# Patient Record
Sex: Female | Born: 1980
Health system: Southern US, Community
[De-identification: ages and names within clinical notes are randomized; demographics above are authoritative.]

## PROBLEM LIST (undated history)

## (undated) ENCOUNTER — Inpatient Hospital Stay (HOSPITAL_COMMUNITY): Payer: Self-pay

## (undated) DIAGNOSIS — R3129 Other microscopic hematuria: Secondary | ICD-10-CM

## (undated) DIAGNOSIS — M26609 Unspecified temporomandibular joint disorder, unspecified side: Secondary | ICD-10-CM

## (undated) DIAGNOSIS — K2970 Gastritis, unspecified, without bleeding: Secondary | ICD-10-CM

## (undated) DIAGNOSIS — F411 Generalized anxiety disorder: Secondary | ICD-10-CM

## (undated) DIAGNOSIS — K219 Gastro-esophageal reflux disease without esophagitis: Secondary | ICD-10-CM

## (undated) DIAGNOSIS — T8859XA Other complications of anesthesia, initial encounter: Secondary | ICD-10-CM

## (undated) DIAGNOSIS — T4145XA Adverse effect of unspecified anesthetic, initial encounter: Secondary | ICD-10-CM

## (undated) HISTORY — DX: Generalized anxiety disorder: F41.1

## (undated) HISTORY — DX: Gastro-esophageal reflux disease without esophagitis: K21.9

## (undated) HISTORY — PX: WISDOM TOOTH EXTRACTION: SHX21

## (undated) HISTORY — DX: Unspecified temporomandibular joint disorder, unspecified side: M26.609

## (undated) HISTORY — DX: Gastritis, unspecified, without bleeding: K29.70

## (undated) HISTORY — DX: Other microscopic hematuria: R31.29

---
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
## 2012-01-01 ENCOUNTER — Other Ambulatory Visit: Payer: Self-pay | Admitting: Physician Assistant

## 2012-01-01 LAB — CBC WITH DIFFERENTIAL/PLATELET
Basophil #: 0.1 10*3/uL (ref 0.0–0.1)
Eosinophil #: 0.3 10*3/uL (ref 0.0–0.7)
Eosinophil %: 3.3 %
Lymphocyte #: 2.3 10*3/uL (ref 1.0–3.6)
MCH: 29.2 pg (ref 26.0–34.0)
MCHC: 34.1 g/dL (ref 32.0–36.0)
MCV: 86 fL (ref 80–100)
Monocyte #: 0.9 x10 3/mm (ref 0.2–0.9)
Neutrophil %: 56.8 %
Platelet: 247 10*3/uL (ref 150–440)
RBC: 4.59 10*6/uL (ref 3.80–5.20)
RDW: 13.3 % (ref 11.5–14.5)

## 2012-03-07 ENCOUNTER — Emergency Department (INDEPENDENT_AMBULATORY_CARE_PROVIDER_SITE_OTHER)
Admission: EM | Admit: 2012-03-07 | Discharge: 2012-03-07 | Disposition: A | Discharge status: Home / Self Care | Payer: Self-pay | Source: Home / Self Care | Attending: Family Medicine | Admitting: Family Medicine

## 2012-03-07 ENCOUNTER — Encounter (HOSPITAL_COMMUNITY): Payer: Self-pay

## 2012-03-07 DIAGNOSIS — H6690 Otitis media, unspecified, unspecified ear: Secondary | ICD-10-CM

## 2012-03-07 DIAGNOSIS — H6691 Otitis media, unspecified, right ear: Secondary | ICD-10-CM

## 2012-03-07 MED ORDER — AMOXICILLIN-POT CLAVULANATE 875-125 MG PO TABS: 1.0000 | ORAL_TABLET | Freq: Two times a day (BID) | ORAL | Status: DC

## 2012-03-07 NOTE — ED Provider Notes (Signed)
History     CSN: [id number]  Arrival date & time 03/07/12  1807   First MD Initiated Contact with Patient 03/07/12 1811      Chief Complaint  Patient presents with  . Otalgia    (Consider location/radiation/quality/duration/timing/severity/associated sxs/prior treatment) Patient is a 31 y.o. female presenting with ear pain. The history is provided by the patient.  Otalgia This is a new problem. The current episode started 2 days ago. There is pain in the right ear. The problem has been gradually worsening. There has been no fever. The pain is mild. Associated symptoms include hearing loss and rhinorrhea. Pertinent negatives include no ear discharge, no sore throat, no diarrhea and no vomiting. Her past medical history is significant for hearing loss.    History reviewed. No pertinent past medical history.  History reviewed. No pertinent past surgical history.  History reviewed. No pertinent family history.  History  Substance Use Topics  . Smoking status: Not on file  . Smokeless tobacco: Not on file  . Alcohol Use: Not on file    OB History    Grav Para Term Preterm Abortions TAB SAB Ect Mult Living                  Review of Systems  Constitutional: Negative.   HENT: Positive for hearing loss, ear pain, congestion and rhinorrhea. Negative for sore throat and ear discharge.   Respiratory: Negative.   Gastrointestinal: Negative for vomiting and diarrhea.    Allergies  Sulfa antibiotics  Home Medications   Current Outpatient Rx  Name  Route  Sig  Dispense  Refill  . AMOXICILLIN-POT CLAVULANATE 875-125 MG PO TABS   Oral   Take 1 tablet by mouth 2 (two) times daily.   20 tablet   0     BP 111/54  Pulse 78  Temp 98.4 F (36.9 C) (Oral)  Resp 16  SpO2 100%  Physical Exam  Nursing note and vitals reviewed. Constitutional: She is oriented to person, place, and time. She appears well-developed and well-nourished.  HENT:  Head: Normocephalic.  Right  Ear: Ear canal normal. Tympanic membrane is injected and erythematous. Tympanic membrane mobility is abnormal.  Left Ear: Hearing, tympanic membrane and external ear normal.  Nose: Nose normal.  Mouth/Throat: Oropharynx is clear and moist.  Eyes: Conjunctivae normal are normal. Pupils are equal, round, and reactive to light.  Neck: Normal range of motion. Neck supple.  Neurological: She is alert and oriented to person, place, and time.  Skin: Skin is warm and dry.    ED Course  Procedures (including critical care time)  Labs Reviewed - No data to display No results found.   1. Otitis media of right ear       MDM          Linna Hoff, MD 03/07/12 1900

## 2012-03-07 NOTE — ED Notes (Signed)
Reports pain, fullness in ear, can't hear in ear ; ?ear infection

## 2012-04-04 ENCOUNTER — Encounter: Payer: Self-pay | Admitting: Family Medicine

## 2012-04-04 ENCOUNTER — Ambulatory Visit (INDEPENDENT_AMBULATORY_CARE_PROVIDER_SITE_OTHER): Payer: 59 | Admitting: Family Medicine

## 2012-04-04 VITALS — BP 112/70 | HR 72 | Temp 98.2°F | Ht 62.0 in | Wt 144.0 lb

## 2012-04-04 DIAGNOSIS — H9201 Otalgia, right ear: Secondary | ICD-10-CM

## 2012-04-04 DIAGNOSIS — H9209 Otalgia, unspecified ear: Secondary | ICD-10-CM

## 2012-04-04 DIAGNOSIS — F411 Generalized anxiety disorder: Secondary | ICD-10-CM

## 2012-04-04 MED ORDER — CLONAZEPAM 1 MG PO TABS: ORAL_TABLET | ORAL | Status: DC

## 2012-04-04 MED ORDER — CITALOPRAM HYDROBROMIDE 20 MG PO TABS: 20.0000 mg | ORAL_TABLET | Freq: Every day | ORAL | Status: DC

## 2012-04-04 NOTE — Assessment & Plan Note (Signed)
Citalopram 20mg  qd started today, #30, RF x 1.  Therapeutic expectations and side effect profile of medication discussed today.  Patient's questions answered. Clonazepam 1mg , 1/2-1 tab q12h prn, #15, no RF---for use if needed while waiting for the citalopram to help. Therapeutic expectations and side effect profile of medication discussed today.  Patient's questions answered.

## 2012-04-04 NOTE — Assessment & Plan Note (Signed)
Resolving.  Sounds like she still has occasional eustacian tube dysfunction but ear has healed completely.

## 2012-04-04 NOTE — Progress Notes (Signed)
Office Note 04/04/2012  CC:  Chief Complaint  Patient presents with  . Establish Care    anxiety and recheck right ear infection    HPI:  Heather Conley is a 32 y.o. White female who is here to establish care. Patient's most recent primary MD: Dr. Lenon Ahmadi in [place], [place].  GYN MD was Dr. Roxan Hockey in [place], [place]. Old records from recent ED visit for ear pain were reviewed prior to or during today's visit.  She had right AOM and was given augmentin.  Currently she feels no persistent right ear complaints.  Has occ feeling that it closes --does valsalva against closed nose and it opens.  Occ random sharp pain in ear--shooting pain.  None of this in the last week, though.  No longer has any nasal cong, runny nose, or cough.  Hx of anxiety: worries about everything, often irritable and can't concentrate, sometimes almost has panic attacks but never had a full blown panic attack.  No depressed mood.  Sleep is fine and appetite is fine.  Zoloft helped in past but sexual side effects were too intense and she d/c'd the med.  She is interested in trying a different med.  Past Medical History  Diagnosis Date  . Chicken pox   . GAD (generalized anxiety disorder)     zoloft in the past helped but sexual side effects caused her to d/c it.    Past Surgical History  Procedure Date  . Wisdom tooth extraction approx 2007    No complications    Family History  Problem Relation Age of Onset  . Alcohol abuse Mother   . Heart disease Maternal Uncle   . Heart disease Maternal Grandfather   . Stroke Paternal Grandmother     History   Social History  . Marital Status: Single    Spouse Name: N/A    Number of Children: N/A  . Years of Education: N/A   Occupational History  . Not on file.   Social History Main Topics  . Smoking status: Never Smoker   . Smokeless tobacco: Never Used  . Alcohol Use: Yes     Comment: social  . Drug Use: No  . Sexually Active: Not on file   Other Topics Concern  .  Not on file   Social History Narrative   Single, no children.  Lives with fiance in [place].Orig from [place].  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   MEDS: ortho tri cyclen 1 tab qd  Allergies  Allergen Reactions  . Sulfa Antibiotics     ROS Review of Systems  Constitutional: Negative for fever and fatigue.  HENT: Positive for ear pain. Negative for congestion and sore throat.   Eyes: Negative for visual disturbance.  Respiratory: Negative for cough.   Cardiovascular: Negative for chest pain.  Gastrointestinal: Negative for nausea and abdominal pain.  Genitourinary: Negative for dysuria.  Musculoskeletal: Negative for back pain and joint swelling.  Skin: Negative for rash.  Neurological: Negative for weakness and headaches.  Hematological: Negative for adenopathy.  Psychiatric/Behavioral: The patient is nervous/anxious.     PE; Blood pressure 112/70, pulse 72, temperature 98.2 F (36.8 C), temperature source Temporal, height 5\' 2"  (1.575 m), weight 144 lb (65.318 kg), SpO2 98.00%. Gen: Alert, well appearing.  Patient is oriented to person, place, time, and situation. AFFECT: pleasant, lucid thought and speech. ENT: Ears: EACs clear, normal epithelium.  TMs with good light reflex and landmarks bilaterally.  Eyes: no injection, icteris, swelling, or exudate.  EOMI, PERRLA. Nose: no drainage or turbinate edema/swelling.  No injection or focal lesion.  Mouth: lips without lesion/swelling.  Oral mucosa pink and moist.  Dentition intact and without obvious caries or gingival swelling.  Oropharynx without erythema, exudate, or swelling.  Neck - No masses or thyromegaly or limitation in range of motion CV: RRR, no m/r/g.   LUNGS: CTA bilat, nonlabored resps, good aeration in all lung fields. EXT: no clubbing, cyanosis, or edema.  Neuro: CN 2-12 intact bilaterally, strength 5/5 in  proximal and distal upper extremities and lower extremities bilaterally.  No sensory deficits.  No tremor.  No disdiadochokinesis.  No ataxia.  Upper extremity and lower extremity DTRs symmetric.  No pronator drift.  Pertinent labs:  None today  ASSESSMENT AND PLAN:   GAD (generalized anxiety disorder) Citalopram 20mg  qd started today, #30, RF x 1.  Therapeutic expectations and side effect profile of medication discussed today.  Patient's questions answered. Clonazepam 1mg , 1/2-1 tab q12h prn, #15, no RF---for use if needed while waiting for the citalopram to help. Therapeutic expectations and side effect profile of medication discussed today.  Patient's questions answered.   Right ear pain Resolving.  Sounds like she still has occasional eustacian tube dysfunction but ear has healed completely.    Return for make appt for CPE at your convenience (morning appt so fasting labs can be done).

## 2012-04-10 ENCOUNTER — Ambulatory Visit (INDEPENDENT_AMBULATORY_CARE_PROVIDER_SITE_OTHER): Payer: 59 | Admitting: Family Medicine

## 2012-04-10 ENCOUNTER — Encounter: Payer: Self-pay | Admitting: Family Medicine

## 2012-04-10 VITALS — BP 116/74 | HR 63 | Ht 62.0 in | Wt 142.0 lb

## 2012-04-10 DIAGNOSIS — L909 Atrophic disorder of skin, unspecified: Secondary | ICD-10-CM

## 2012-04-10 DIAGNOSIS — Z0000 Encounter for general adult medical examination without abnormal findings: Secondary | ICD-10-CM

## 2012-04-10 DIAGNOSIS — L918 Other hypertrophic disorders of the skin: Secondary | ICD-10-CM

## 2012-04-10 DIAGNOSIS — Z808 Family history of malignant neoplasm of other organs or systems: Secondary | ICD-10-CM

## 2012-04-10 LAB — CBC WITH DIFFERENTIAL/PLATELET
Basophils Absolute: 0 10*3/uL (ref 0.0–0.1)
Eosinophils Absolute: 0.2 10*3/uL (ref 0.0–0.7)
Eosinophils Relative: 3.9 % (ref 0.0–5.0)
HCT: 40.9 % (ref 36.0–46.0)
Lymphs Abs: 1.9 10*3/uL (ref 0.7–4.0)
MCHC: 32.9 g/dL (ref 30.0–36.0)
MCV: 85.5 fl (ref 78.0–100.0)
Monocytes Absolute: 0.6 10*3/uL (ref 0.1–1.0)
Neutro Abs: 3.2 10*3/uL (ref 1.4–7.7)
Neutrophils Relative %: 54.5 % (ref 43.0–77.0)
Platelets: 282 10*3/uL (ref 150.0–400.0)
WBC: 5.9 10*3/uL (ref 4.5–10.5)

## 2012-04-10 LAB — LIPID PANEL
Cholesterol: 176 mg/dL (ref 0–200)
HDL: 70.8 mg/dL (ref >39.00)
Triglycerides: 89 mg/dL (ref 0.0–149.0)

## 2012-04-10 LAB — COMPREHENSIVE METABOLIC PANEL
BUN: 17 mg/dL (ref 6–23)
CO2: 25 mEq/L (ref 19–32)
Calcium: 8.7 mg/dL (ref 8.4–10.5)
Chloride: 106 mEq/L (ref 96–112)
Creatinine, Ser: 0.7 mg/dL (ref 0.4–1.2)
GFR: 104.66 mL/min (ref >60.00)
Total Bilirubin: 0.6 mg/dL (ref 0.3–1.2)

## 2012-04-10 NOTE — Assessment & Plan Note (Signed)
Reviewed age and gender appropriate health maintenance issues (prudent diet, regular exercise, health risks of tobacco and excessive alcohol, use of seatbelts, fire alarms in home, use of sunscreen).  Also reviewed age and gender appropriate health screening as well as vaccine recommendations. Pt to get pap/pelvic via her GYN MD. She requested derm referral today due to some bothersome skin tags, +FH of some skin cancers.  I ordered this referral today. Pt to take MVI qd.  Vaccines UTD.

## 2012-04-10 NOTE — Patient Instructions (Signed)
Check for history of Tdap vaccine.  Health Maintenance, Females A healthy lifestyle and preventative care can promote health and wellness.  Maintain regular health, dental, and eye exams.  Eat a healthy diet. Foods like vegetables, fruits, whole grains, low-fat dairy products, and lean protein foods contain the nutrients you need without too many calories. Decrease your intake of foods high in solid fats, added sugars, and salt. Get information about a proper diet from your caregiver, if necessary.  Regular physical exercise is one of the most important things you can do for your health. Most adults should get at least 150 minutes of moderate-intensity exercise (any activity that increases your heart rate and causes you to sweat) each week. In addition, most adults need muscle-strengthening exercises on 2 or more days a week.   Maintain a healthy weight. The body mass index (BMI) is a screening tool to identify possible weight problems. It provides an estimate of body fat based on height and weight. Your caregiver can help determine your BMI, and can help you achieve or maintain a healthy weight. For adults 20 years and older:  A BMI below 18.5 is considered underweight.  A BMI of 18.5 to 24.9 is normal.  A BMI of 25 to 29.9 is considered overweight.  A BMI of 30 and above is considered obese.  Maintain normal blood lipids and cholesterol by exercising and minimizing your intake of saturated fat. Eat a balanced diet with plenty of fruits and vegetables. Blood tests for lipids and cholesterol should begin at age 27 and be repeated every 5 years. If your lipid or cholesterol levels are high, you are over 50, or you are a high risk for heart disease, you may need your cholesterol levels checked more frequently.Ongoing high lipid and cholesterol levels should be treated with medicines if diet and exercise are not effective.  If you smoke, find out from your caregiver how to quit. If you do not  use tobacco, do not start.  If you are pregnant, do not drink alcohol. If you are breastfeeding, be very cautious about drinking alcohol. If you are not pregnant and choose to drink alcohol, do not exceed 1 drink per day. One drink is considered to be 12 ounces (355 mL) of beer, 5 ounces (148 mL) of wine, or 1.5 ounces (44 mL) of liquor.  Avoid use of street drugs. Do not share needles with anyone. Ask for help if you need support or instructions about stopping the use of drugs.  High blood pressure causes heart disease and increases the risk of stroke. Blood pressure should be checked at least every 1 to 2 years. Ongoing high blood pressure should be treated with medicines, if weight loss and exercise are not effective.  If you are 25 to 30 years old, ask your caregiver if you should take aspirin to prevent strokes.  Diabetes screening involves taking a blood sample to check your fasting blood sugar level. This should be done once every 3 years, after age 54, if you are within normal weight and without risk factors for diabetes. Testing should be considered at a younger age or be carried out more frequently if you are overweight and have at least 1 risk factor for diabetes.  Breast cancer screening is essential preventative care for women. You should practice "breast self-awareness." This means understanding the normal appearance and feel of your breasts and may include breast self-examination. Any changes detected, no matter how small, should be reported to a caregiver. Women  in their 48s and 30s should have a clinical breast exam (CBE) by a caregiver as part of a regular health exam every 1 to 3 years. After age 65, women should have a CBE every year. Starting at age 62, women should consider having a mammogram (breast X-ray) every year. Women who have a family history of breast cancer should talk to their caregiver about genetic screening. Women at a high risk of breast cancer should talk to their  caregiver about having an MRI and a mammogram every year.  The Pap test is a screening test for cervical cancer. Women should have a Pap test starting at age 71. Between ages 62 and 44, Pap tests should be repeated every 2 years. Beginning at age 25, you should have a Pap test every 3 years as long as the past 3 Pap tests have been normal. If you had a hysterectomy for a problem that was not cancer or a condition that could lead to cancer, then you no longer need Pap tests. If you are between ages 81 and 11, and you have had normal Pap tests going back 10 years, you no longer need Pap tests. If you have had past treatment for cervical cancer or a condition that could lead to cancer, you need Pap tests and screening for cancer for at least 20 years after your treatment. If Pap tests have been discontinued, risk factors (such as a new sexual partner) need to be reassessed to determine if screening should be resumed. Some women have medical problems that increase the chance of getting cervical cancer. In these cases, your caregiver may recommend more frequent screening and Pap tests.  The human papillomavirus (HPV) test is an additional test that may be used for cervical cancer screening. The HPV test looks for the virus that can cause the cell changes on the cervix. The cells collected during the Pap test can be tested for HPV. The HPV test could be used to screen women aged 8 years and older, and should be used in women of any age who have unclear Pap test results. After the age of 82, women should have HPV testing at the same frequency as a Pap test.  Colorectal cancer can be detected and often prevented. Most routine colorectal cancer screening begins at the age of 51 and continues through age 6. However, your caregiver may recommend screening at an earlier age if you have risk factors for colon cancer. On a yearly basis, your caregiver may provide home test kits to check for hidden blood in the stool. Use  of a small camera at the end of a tube, to directly examine the colon (sigmoidoscopy or colonoscopy), can detect the earliest forms of colorectal cancer. Talk to your caregiver about this at age 16, when routine screening begins. Direct examination of the colon should be repeated every 5 to 10 years through age 43, unless early forms of pre-cancerous polyps or small growths are found.  Hepatitis C blood testing is recommended for all people born from [birth year] through 1963 and any individual with known risks for hepatitis C.  Practice safe sex. Use condoms and avoid high-risk sexual practices to reduce the spread of sexually transmitted infections (STIs). Sexually active women aged 64 and younger should be checked for Chlamydia, which is a common sexually transmitted infection. Older women with new or multiple partners should also be tested for Chlamydia. Testing for other STIs is recommended if you are sexually active and at increased risk.  Osteoporosis is a disease in which the bones lose minerals and strength with aging. This can result in serious bone fractures. The risk of osteoporosis can be identified using a bone density scan. Women ages 40 and over and women at risk for fractures or osteoporosis should discuss screening with their caregivers. Ask your caregiver whether you should be taking a calcium supplement or vitamin D to reduce the rate of osteoporosis.  Menopause can be associated with physical symptoms and risks. Hormone replacement therapy is available to decrease symptoms and risks. You should talk to your caregiver about whether hormone replacement therapy is right for you.  Use sunscreen with a sun protection factor (SPF) of 30 or greater. Apply sunscreen liberally and repeatedly throughout the day. You should seek shade when your shadow is shorter than you. Protect yourself by wearing long sleeves, pants, a wide-brimmed hat, and sunglasses year round, whenever you are outdoors.  Notify  your caregiver of new moles or changes in moles, especially if there is a change in shape or color. Also notify your caregiver if a mole is larger than the size of a pencil eraser.  Stay current with your immunizations. Document Released: 09/25/2010 Document Revised: 06/04/2011 Document Reviewed: 09/25/2010 [facility] Patient Information 2013 [place], [place].

## 2012-04-10 NOTE — Progress Notes (Signed)
Office Note 04/10/2012  CC:  Chief Complaint  Patient presents with  . Annual Exam    No problems    HPI:  Heather Conley is a 32 y.o. White female who is here for CPE. No new complaints. Started citalopram for GAD 6d/a and feels no significant side effects.  Anxiety/mood no different yet. She has not had to take any clonazepam.   Past Medical History  Diagnosis Date  . Chicken pox   . GAD (generalized anxiety disorder)     zoloft in the past helped but sexual side effects caused her to d/c it.    Past Surgical History  Procedure Date  . Wisdom tooth extraction approx 2007    No complications    Family History  Problem Relation Age of Onset  . Alcohol abuse Mother   . Heart disease Maternal Uncle   . Heart disease Maternal Grandfather   . Stroke Paternal Grandmother     History   Social History  . Marital Status: Single    Spouse Name: N/A    Number of Children: N/A  . Years of Education: N/A   Occupational History  . Not on file.   Social History Main Topics  . Smoking status: Never Smoker   . Smokeless tobacco: Never Used  . Alcohol Use: Yes     Comment: social  . Drug Use: No  . Sexually Active: Not on file   Other Topics Concern  . Not on file   Social History Narrative   Single, no children.  Lives with fiance in [place].Orig from [place].  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    Outpatient Prescriptions Prior to Visit  Medication Sig Dispense Refill  . citalopram (CELEXA) 20 MG tablet Take 1 tablet (20 mg total) by mouth daily.  30 tablet  1  . clonazePAM (KLONOPIN) 1 MG tablet 1/2-1 tab po q12h prn anxiety  15 tablet  0  . Norgestim-Eth Estrad Triphasic (ORTHO TRI-CYCLEN, 28, PO) Take 1 tablet by mouth daily.      Last reviewed on 04/10/2012  8:48 AM by Luisa Dago, CMA  Allergies  Allergen Reactions  . Sulfa Antibiotics      ROS Review of Systems  Constitutional: Negative for fever, chills, appetite change and fatigue.  HENT: Negative for ear pain, congestion, sore throat, neck stiffness and dental problem.   Eyes: Negative for discharge, redness and visual disturbance.  Respiratory: Negative for cough, chest tightness, shortness of breath and wheezing.   Cardiovascular: Negative for chest pain, palpitations and leg swelling.  Gastrointestinal: Negative for nausea, vomiting, abdominal pain, diarrhea and blood in stool.  Genitourinary: Negative for dysuria, urgency, frequency, hematuria, flank pain and difficulty urinating.  Musculoskeletal: Negative for myalgias, back pain, joint swelling and arthralgias.  Skin: Negative for pallor and rash.  Neurological: Negative for dizziness, speech difficulty, weakness and headaches.  Hematological: Negative for adenopathy. Does not bruise/bleed easily.  Psychiatric/Behavioral: Negative for confusion and sleep disturbance. The patient is nervous/anxious (chronic--recently started citalopram for this).     PE; Blood pressure 116/74, pulse 63, height 5\' 2"  (1.575 m), weight 142 lb (64.411 kg). Gen: Alert, well appearing.  Patient is oriented to person, place, time, and situation. AFFECT: pleasant, lucid thought and speech. ENT: Ears: EACs clear, normal epithelium.  TMs with good light reflex and landmarks bilaterally.  Eyes: no injection, icteris, swelling, or exudate.  EOMI,  PERRLA. Nose: no drainage or turbinate edema/swelling.  No injection or focal lesion.  Mouth: lips without lesion/swelling.  Oral mucosa pink and moist.  Dentition intact and without obvious caries or gingival swelling.  Oropharynx without erythema, exudate, or swelling.  Neck: supple/nontender.  No LAD, mass, or TM.   CV: RRR, no m/r/g.   LUNGS: CTA bilat, nonlabored resps, good aeration in all lung fields. ABD: soft, NT, ND, BS normal.  No hepatospenomegaly or mass.  No bruits. EXT: no clubbing,  cyanosis, or edema.  Musculoskeletal: no joint swelling, erythema, warmth, or tenderness.  ROM of all joints intact. Skin - no sores or suspicious lesions or rashes or color changes.  A few small, pigmented skin tags are noted on right shoulder area.    Pertinent labs:  None today  ASSESSMENT AND PLAN:   Health maintenance examination Reviewed age and gender appropriate health maintenance issues (prudent diet, regular exercise, health risks of tobacco and excessive alcohol, use of seatbelts, fire alarms in home, use of sunscreen).  Also reviewed age and gender appropriate health screening as well as vaccine recommendations. Pt to get pap/pelvic via her GYN MD. She requested derm referral today due to some bothersome skin tags, +FH of some skin cancers.  I ordered this referral today. Pt to take MVI qd.  Vaccines UTD.  An After Visit Summary was printed and given to the patient.  FOLLOW UP:  Return for f/u3-4 wks for GAD.

## 2012-04-29 ENCOUNTER — Encounter: Payer: Self-pay | Admitting: Family Medicine

## 2012-04-29 ENCOUNTER — Ambulatory Visit (INDEPENDENT_AMBULATORY_CARE_PROVIDER_SITE_OTHER): Payer: 59 | Admitting: Family Medicine

## 2012-04-29 VITALS — BP 110/73 | HR 92 | Ht 62.0 in | Wt 144.0 lb

## 2012-04-29 DIAGNOSIS — R0600 Dyspnea, unspecified: Secondary | ICD-10-CM

## 2012-04-29 DIAGNOSIS — J9801 Acute bronchospasm: Secondary | ICD-10-CM | POA: Insufficient documentation

## 2012-04-29 DIAGNOSIS — R0989 Other specified symptoms and signs involving the circulatory and respiratory systems: Secondary | ICD-10-CM

## 2012-04-29 DIAGNOSIS — R05 Cough: Secondary | ICD-10-CM

## 2012-04-29 DIAGNOSIS — F411 Generalized anxiety disorder: Secondary | ICD-10-CM

## 2012-04-29 MED ORDER — ALBUTEROL SULFATE (5 MG/ML) 0.5% IN NEBU: 2.5000 mg | INHALATION_SOLUTION | Freq: Once | RESPIRATORY_TRACT | Status: DC

## 2012-04-29 MED ORDER — IPRATROPIUM-ALBUTEROL 0.5-2.5 (3) MG/3ML IN SOLN
3.0000 mL | Freq: Once | RESPIRATORY_TRACT | Status: AC
  Administered 2012-04-29: 3 mL via RESPIRATORY_TRACT

## 2012-04-29 MED ORDER — ALBUTEROL SULFATE HFA 108 (90 BASE) MCG/ACT IN AERS: 2.0000 | INHALATION_SPRAY | RESPIRATORY_TRACT | Status: DC | PRN

## 2012-04-29 MED ORDER — IPRATROPIUM-ALBUTEROL 0.5-2.5 (3) MG/3ML IN SOLN: 3.0000 mL | RESPIRATORY_TRACT | Status: DC

## 2012-04-29 MED ORDER — IPRATROPIUM BROMIDE 0.02 % IN SOLN: 0.5000 mg | Freq: Once | RESPIRATORY_TRACT | Status: DC

## 2012-04-29 NOTE — Assessment & Plan Note (Signed)
Improving appropriately. Continue 20mg  citalopram qd. Recheck 6 wks.

## 2012-04-29 NOTE — Progress Notes (Signed)
OFFICE NOTE  04/29/2012  CC:  Chief Complaint  Patient presents with  . Follow-up    GAD--meds doing well, also has cough and feels a bit short of breath, feels well otherwise-? med side effect     HPI: Patient is a 32 y.o. Caucasian female who is here for 3+ wk f/u anxiety.  Started citalopram 20mg  and prn clonazepam last visit.  Dry mouth initially from med but this has resolved.  Feels like cital is helping--calmer and less stressed about things.  Has not had to take clonazepam.  1 wk hx of intermittent feeling of SOB that lasts minutes, gets some coughing that is dry/deep/heavy and is worse at night and sometimes assoc with chest wheeze but wheezing not persistent or as notable as her other sx's.  It hurts in central chest area sometimes with a forceful cough, otherwise no CP.  No palpitations, no diaphoresis, no nausea.  This has happened before--on and off since 05/2011.  Also occured in [facility] but moreso since moving to [facility]. No nasal sx's with this.  No fevers.  She feels no malaise or fatigue.  No acute/chronic leg pains.  No prolonged immobility, no recent surgical procedure.  No FH of thrombosis/PE.  ROS: +GER more lately  Pertinent PMH:  Past Medical History  Diagnosis Date  . Chicken pox   . GAD (generalized anxiety disorder)     zoloft in the past helped but sexual side effects caused her to d/c it.   Past surgical, social, and family history reviewed and no changes noted since last office visit.  MEDS:  Outpatient Prescriptions Prior to Visit  Medication Sig Dispense Refill  . citalopram (CELEXA) 20 MG tablet Take 1 tablet (20 mg total) by mouth daily.  30 tablet  1  . Norgestim-Eth Estrad Triphasic (ORTHO TRI-CYCLEN, 28, PO) Take 1 tablet by mouth daily.      . clonazePAM (KLONOPIN) 1 MG tablet 1/2-1 tab po q12h prn anxiety  15 tablet  0   Last reviewed on 04/29/2012  9:39 AM by Jeoffrey Massed, MD  PE: Blood pressure 110/73, pulse 92, height 5\' 2"  (1.575 m), weight 144  lb (65.318 kg), SpO2 96.00%. Gen: Alert, well appearing.  Patient is oriented to person, place, time, and situation. ENT: Ears: EACs clear, normal epithelium.  TMs with good light reflex and landmarks bilaterally.  Eyes: no injection, icteris, swelling, or exudate.  EOMI, PERRLA. Nose: no drainage or turbinate edema/swelling.  No injection or focal lesion.  Mouth: lips without lesion/swelling.  Oral mucosa pink and moist.  Dentition intact and without obvious caries or gingival swelling.  Oropharynx without erythema, exudate, or swelling.  CV: RRR, no m/r/g.   LUNGS: CTA bilat, nonlabored resps, good aeration in all lung fields. EXT: no clubbing, cyanosis, or edema.   No leg tenderness, no palpable knots or cords.  IMPRESSION AND PLAN:  GAD (generalized anxiety disorder) Improving appropriately. Continue 20mg  citalopram qd. Recheck 6 wks.  Bronchospasm I think she is having some intermittent NONWHEEZING-type asthma.  Decent subjective response to alb/atr neb in office today. Her clinical picture does not match PE or cardiac disorder. Will do trial of prn Ventolin HFA 2 puffs q4h.  Inhaler education done today by CMA Francee Piccolo. Therapeutic expectations and side effect profile of medication discussed today.  Patient's questions answered.   An After Visit Summary was printed and given to the patient.  FOLLOW UP: 6 wks

## 2012-04-29 NOTE — Assessment & Plan Note (Signed)
I think she is having some intermittent NONWHEEZING-type asthma.  Decent subjective response to alb/atr neb in office today. Her clinical picture does not match PE or cardiac disorder. Will do trial of prn Ventolin HFA 2 puffs q4h.  Inhaler education done today by CMA Francee Piccolo. Therapeutic expectations and side effect profile of medication discussed today.  Patient's questions answered.

## 2012-05-07 ENCOUNTER — Emergency Department (INDEPENDENT_AMBULATORY_CARE_PROVIDER_SITE_OTHER): Payer: 59

## 2012-05-07 ENCOUNTER — Emergency Department (HOSPITAL_COMMUNITY)
Admission: EM | Admit: 2012-05-07 | Discharge: 2012-05-07 | Disposition: A | Discharge status: Home / Self Care | Payer: 59 | Source: Home / Self Care | Attending: Emergency Medicine | Admitting: Emergency Medicine

## 2012-05-07 ENCOUNTER — Encounter (HOSPITAL_COMMUNITY): Payer: Self-pay

## 2012-05-07 DIAGNOSIS — R0602 Shortness of breath: Secondary | ICD-10-CM

## 2012-05-07 LAB — D-DIMER, QUANTITATIVE: D-Dimer, Quant: <0.27 ug/mL-FEU (ref 0.00–0.48)

## 2012-05-07 NOTE — ED Notes (Addendum)
Over past 3 weeks, has spent 8 h on plane between here and [place] to [place]; since her return, she has been having episodic SOB and feeling lightheaded. Denies pain, NAD at present ; MD at the cath lab suggested to her she should consider having a D-Dimer done; denies pain in legs , calves, thighs. No wheezing or rales, auscultated

## 2012-05-07 NOTE — ED Provider Notes (Signed)
History     CSN: [id number]  Arrival date & time 05/07/12  1212   First MD Initiated Contact with Patient 05/07/12 1225      Chief Complaint  Patient presents with  . 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 Patient describes that for about 3 weeks she's been feeling intermittently short of breath and had been seen by her primary care Dr. for this symptom at this point was interpreted as symptoms of generalized anxiety disorder and she was prescribed Celexa which she has been taking.  Patient decided to come in and be checked as she continues to experience this recurrent intermittent shortness of breath. Patient denies any chest pain, wheezing, fevers or cough. She also describes and denies lower extremity swelling. Patient denies having had a history of any hematological disorders, previous DVTs or PEs. She describes she went to [place] (by plane)  about 3 weeks ago.  Patient is a 32 y.o. female presenting with shortness of breath. The history is provided by the patient.  Shortness of Breath Severity:  Mild Onset quality:  Gradual Timing:  Constant Chronicity:  New Context: activity   Relieved by:  Nothing Worsened by:  Deep breathing Ineffective treatments:  None tried Associated symptoms: no chest pain, no claudication, no cough, no diaphoresis, no fever, no headaches, no hemoptysis, no neck pain, no sore throat, no sputum production, no syncope, no swollen glands and no wheezing   Risk factors: oral contraceptive use and prolonged immobilization   Risk factors: no family hx of DVT, no hx of PE/DVT, no recent surgery and no tobacco use     Past Medical History  Diagnosis Date  . Chicken pox   .  GAD (generalized anxiety disorder)     zoloft in the past helped but sexual side effects caused her to d/c it.    Past Surgical History  Procedure Laterality Date  . Wisdom tooth extraction  approx 2007    No complications    Family History  Problem Relation Age of Onset  . Alcohol abuse Mother   . Heart disease Maternal Uncle   . Heart disease Maternal Grandfather   . Stroke Paternal Grandmother     History  Substance Use Topics  . Smoking status: Never Smoker   . Smokeless tobacco: Never Used  . Alcohol Use: Yes     Comment: social    OB History   Grav Para Term Preterm Abortions TAB SAB Ect Mult Living                  Review of Systems  Constitutional: Negative for fever, chills, diaphoresis, activity change, appetite change and fatigue.  HENT: Negative for sore throat and neck pain.   Respiratory: Positive for shortness of breath. Negative for apnea, cough, hemoptysis, sputum production, choking, chest tightness, wheezing and stridor.   Cardiovascular: Negative for chest pain, palpitations, claudication, leg swelling and syncope.  Skin: Negative for color change.  Neurological: Negative for dizziness, weakness, light-headedness and headaches.    Allergies  Sulfa antibiotics  Home Medications   Current Outpatient Rx  Name  Route  Sig  Dispense  Refill  . albuterol (VENTOLIN HFA) 108 (90 BASE) MCG/ACT inhaler   Inhalation   Inhale 2 puffs into the lungs every 4 (four)  hours as needed for wheezing.   1 Inhaler   0   . citalopram (CELEXA) 20 MG tablet   Oral   Take 1 tablet (20 mg total) by mouth daily.   30 tablet   1   . clonazePAM (KLONOPIN) 1 MG tablet      1/2-1 tab po q12h prn anxiety   15 tablet   0   . Multiple Vitamin (MULTIVITAMIN) tablet   Oral   Take 1 tablet by mouth daily.         Lorita Officer Triphasic (ORTHO TRI-CYCLEN, 28, PO)   Oral   Take 1 tablet by mouth daily.           BP 113/61  Pulse 64  Temp(Src) 98  F (36.7 C) (Oral)  Resp 16  SpO2 100%  LMP 04/28/2012  Physical Exam  Constitutional: Vital signs are normal. She appears well-developed and well-nourished.  Non-toxic appearance. She does not have a sickly appearance. She does not appear ill. No distress.  HENT:  Head: Normocephalic.  Eyes: Conjunctivae are normal. Pupils are equal, round, and reactive to light.  Neck: Neck supple.  Cardiovascular: Normal rate and regular rhythm.  Exam reveals no gallop and no friction rub.   No murmur heard. Pulmonary/Chest: Effort normal and breath sounds normal.  Neurological: She is alert.  Skin: No erythema. No pallor.    ED Course  Procedures (including critical care time)  Labs Reviewed  D-DIMER, QUANTITATIVE   Dg Chest 2 View  05/07/2012  *RADIOLOGY REPORT*  Clinical Data: Shortness of breath.  CHEST - 2 VIEW  Comparison: None.  Findings: Normal heart size with clear lung fields.  No bony abnormality.  No pneumothorax or effusion.  IMPRESSION: No active cardiopulmonary disease.   Original Report Authenticated By: Davonna Belling, M.D.      1. Shortness of breath     Bedside informational ( nondiagnostic )long parasternal axis view with echo with no gross abnormalities seen  MDM  In term attempt dyspnea. Patient was evaluated for low probability of pulmonary embolism as per risk factors and symptomatology. Patient had a normal x-ray, normal d-dimer it was partially asymptomatic at time of discharge. Have discussed with patient to followup with her primary care Dr. and to continue on Celexa and clonazepam as previously prescribed. Symptoms were most consistent with anxiety, related symptoms.      Jimmie Molly, MD 05/07/12 1536

## 2012-05-10 ENCOUNTER — Other Ambulatory Visit: Payer: Self-pay

## 2012-06-06 ENCOUNTER — Other Ambulatory Visit: Payer: Self-pay | Admitting: *Deleted

## 2012-06-06 MED ORDER — CITALOPRAM HYDROBROMIDE 20 MG PO TABS: 20.0000 mg | ORAL_TABLET | Freq: Every day | ORAL | Status: DC

## 2012-06-06 NOTE — Telephone Encounter (Signed)
Faxed refill request received from pharmacy for CITALOPRAM Last filled by MD on 04/04/12, #30 X 1 Last seen on 04/29/12 Follow up 06/10/12 30 day RX sent.

## 2012-06-10 ENCOUNTER — Encounter: Payer: Self-pay | Admitting: Family Medicine

## 2012-06-10 ENCOUNTER — Ambulatory Visit (INDEPENDENT_AMBULATORY_CARE_PROVIDER_SITE_OTHER): Payer: 59 | Admitting: Family Medicine

## 2012-06-10 VITALS — BP 104/69 | HR 64 | Temp 97.5°F | Resp 14 | Wt 140.0 lb

## 2012-06-10 DIAGNOSIS — F411 Generalized anxiety disorder: Secondary | ICD-10-CM

## 2012-06-10 DIAGNOSIS — J9801 Acute bronchospasm: Secondary | ICD-10-CM

## 2012-06-10 MED ORDER — CITALOPRAM HYDROBROMIDE 20 MG PO TABS: 20.0000 mg | ORAL_TABLET | Freq: Every day | ORAL | Status: DC

## 2012-06-10 NOTE — Progress Notes (Signed)
OFFICE NOTE  06/10/2012  CC:  Chief Complaint  Patient presents with  . Follow-up    6 wk Bronchospasm     HPI: Patient is a 32 y.o. Caucasian female who is here for 6 wk f/u anxiety as well as bronchospasm. I gave her albuterol inhaler last visit.  She went to ED about a week after I last saw her for ongoing SOB periods: she was concerned about PE.  They did a d-dimer and this was neg.  Her CXR was normal.  She was reassured: no CT chest was indicated.   She has not had any SOB in about 2 wks now.  She feels less anxious--essentially she feels like the citalopram has "kicked in" now.  When discussing her SOB, she admits it is tied to anxiety but it is not ever a "full blown" period of impending doom and uncontrolled panic/anxiety.   She has not really been using her albuterol b/c it makes her feel too tremulous.  Pertinent PMH:  Past Medical History  Diagnosis Date  . Chicken pox   . GAD (generalized anxiety disorder)     zoloft in the past helped but sexual side effects caused her to d/c it.    MEDS:  Outpatient Prescriptions Prior to Visit  Medication Sig Dispense Refill  . albuterol (VENTOLIN HFA) 108 (90 BASE) MCG/ACT inhaler Inhale 2 puffs into the lungs every 4 (four) hours as needed for wheezing.  1 Inhaler  0  . citalopram (CELEXA) 20 MG tablet Take 1 tablet (20 mg total) by mouth daily.  30 tablet  0  . clonazePAM (KLONOPIN) 1 MG tablet 1/2-1 tab po q12h prn anxiety  15 tablet  0  . Multiple Vitamin (MULTIVITAMIN) tablet Take 1 tablet by mouth daily.      Lorita Officer Triphasic (ORTHO TRI-CYCLEN, 28, PO) Take 1 tablet by mouth daily.       No facility-administered medications prior to visit.    PE: Blood pressure 104/69, pulse 64, temperature 97.5 F (36.4 C), temperature source Temporal, resp. rate 14, weight 140 lb (63.504 kg), SpO2 96.00%. Gen: Alert, well appearing.  Patient is oriented to person, place, time, and situation. AFFECT: pleasant, lucid  thought and speech. CV: RRR, no m/r/g.   LUNGS: CTA bilat, nonlabored resps, good aeration in all lung fields.   IMPRESSION AND PLAN:  GAD (generalized anxiety disorder) With periods approaching panic, mainly manifested by SOB and CP--but these physical symptoms have abated substantially over the last 2 wks. I feel like her citalopram is helping much more now that she has been on it 2 mo, and we discussed options of staying at same dose or increasing to 30 or 40mg  qd.  Decided to stick with 20mg  qd--RF's done today.  She'll call or return if she has to increase her dose any. I encouraged her to try her clonazepam at 1/2 dose one time to see how it makes her feel.   An After Visit Summary was printed and given to the patient.  FOLLOW UP: 52mo

## 2012-06-10 NOTE — Addendum Note (Signed)
Addended by: Regis Bill on: 06/10/2012 10:27 AM   Modules accepted: Orders

## 2012-06-10 NOTE — Assessment & Plan Note (Signed)
With periods approaching panic, mainly manifested by SOB and CP--but these physical symptoms have abated substantially over the last 2 wks. I feel like her citalopram is helping much more now that she has been on it 2 mo, and we discussed options of staying at same dose or increasing to 30 or 40mg  qd.  Decided to stick with 20mg  qd--RF's done today.  She'll call or return if she has to increase her dose any. I encouraged her to try her clonazepam at 1/2 dose one time to see how it makes her feel.

## 2012-06-10 NOTE — Addendum Note (Signed)
Addended by: Regis Bill on: 06/10/2012 10:33 AM   Modules accepted: Orders

## 2012-07-15 ENCOUNTER — Encounter: Payer: Self-pay | Admitting: Nurse Practitioner

## 2012-07-15 ENCOUNTER — Ambulatory Visit (INDEPENDENT_AMBULATORY_CARE_PROVIDER_SITE_OTHER): Payer: 59 | Admitting: Nurse Practitioner

## 2012-07-15 VITALS — BP 92/58 | HR 74 | Temp 98.4°F | Ht 62.0 in

## 2012-07-15 DIAGNOSIS — R319 Hematuria, unspecified: Secondary | ICD-10-CM

## 2012-07-15 DIAGNOSIS — N390 Urinary tract infection, site not specified: Secondary | ICD-10-CM

## 2012-07-15 DIAGNOSIS — R52 Pain, unspecified: Secondary | ICD-10-CM

## 2012-07-15 DIAGNOSIS — R1013 Epigastric pain: Secondary | ICD-10-CM

## 2012-07-15 LAB — POCT URINALYSIS DIPSTICK
Nitrite, UA: NEGATIVE
Protein, UA: NEGATIVE
Urobilinogen, UA: 1
pH, UA: 6

## 2012-07-15 LAB — POCT URINE PREGNANCY: Preg Test, Ur: NEGATIVE

## 2012-07-15 MED ORDER — NITROFURANTOIN MONOHYD MACRO 100 MG PO CAPS: 100.0000 mg | ORAL_CAPSULE | Freq: Two times a day (BID) | ORAL | Status: DC

## 2012-07-15 MED ORDER — FAMOTIDINE 40 MG PO TABS: 40.0000 mg | ORAL_TABLET | Freq: Every day | ORAL | Status: DC

## 2012-07-15 NOTE — Patient Instructions (Addendum)
I think your abdominal pain may be related gastritis, but considering you have some blood and white cells in your urine today, the pain may be related to UTI. I recommend starting the antibiotic for treatment of UTI and using pepcid daily for at least 4 weeks. You will need to follow up with Dr. Milinda Cave in 1 month to recheck urine and to discuss continuing or discontinuing pepcid. Call our office if your symptoms worsen or do not improve after 1 week. Stop NSAIDS, no alcohol, and food with coffee until you are re-evaluated. Feel better!

## 2012-07-15 NOTE — Progress Notes (Signed)
Subjective:     Patient ID: Heather Conley, female   DOB: [birth year], 32 y.o.   MRN: [id number]  HPI Comments: Heather Conley is a 32 yr old female who presents for evaluation of abdominal pain described as burning episodes accompanied by some nausea and radiating pain to back bilaterally. Episodes have been occuring for about  10 days, and occur 1-2 times daily, lasting about an hour. No aggravating or relieving factors noticed.    Review of Systems  Constitutional: Negative for fever, appetite change and fatigue.  HENT: Negative for sore throat and rhinorrhea.   Respiratory: Positive for shortness of breath (when episodes occur). Negative for cough.   Gastrointestinal: Positive for nausea (no vomiting) and blood in stool. Negative for vomiting, abdominal pain (burning pain, lasts for 1 hour), diarrhea, constipation and abdominal distention.  Genitourinary: Positive for flank pain. Negative for dysuria, frequency and menstrual problem.  Musculoskeletal: Positive for back pain (low back pain assoc with pain episodes). Negative for arthralgias.  Skin: Negative for rash.  Allergic/Immunologic: Negative for food allergies.  Neurological: Positive for light-headedness (associated with painful episodes and some dizziness). Negative for headaches.  Hematological: Bruises/bleeds easily.  Psychiatric/Behavioral: Negative for sleep disturbance, dysphoric mood and agitation.       Objective:   Physical Exam  Vitals reviewed. Constitutional: She is oriented to person, place, and time. She appears well-developed and well-nourished. No distress (pt is concerned about pain, asking a lot of appropriate questions).  HENT:  Head: Normocephalic and atraumatic.  Right Ear: External ear normal.  Left Ear: External ear normal.  Nose: Nose normal.  Mouth/Throat: Oropharynx is clear and moist. No oropharyngeal exudate.  Eyes: Conjunctivae are normal.  Neck: Normal range of motion. Neck supple. No thyromegaly  present.  Cardiovascular: Normal rate, regular rhythm and normal heart sounds.   No murmur heard. Pulmonary/Chest: Effort normal and breath sounds normal. No respiratory distress. She has no wheezes.  Abdominal: Soft. Normal appearance and bowel sounds are normal. She exhibits no distension, no ascites and no mass. There is no hepatosplenomegaly. There is tenderness in the epigastric area. There is no rigidity, no rebound, no guarding, no CVA tenderness and no tenderness at McBurney's point. No hernia.  Musculoskeletal:  C/o low back pain, described as across back, occurs when has abdominal episodes. No CVA tenderness bilat.  Lymphadenopathy:    She has no cervical adenopathy.  Neurological: She is alert and oriented to person, place, and time.  Skin: Skin is warm and dry. No rash noted.  Psychiatric: She has a normal mood and affect. Her behavior is normal. Thought content normal.       Assessment:     1) epigastric abdominal pain. POC Preg neg. 2)UTI-mod blood and white cells on dipstick, flank pain 3)hematuria on dipstick    Plan:     1)Trial of pepcid, cessation of NSAIDS and ETOH. May be related to UTI. CBC & CMet today. 2)nitrfurantoin. Return for recheck of urine in 4 weeks or sooner if abdominal & flank pain worsen or do not improve. Use back up method contraception until next cycle to prevent pregnancy.  3) Recheck urine in 4 weeks

## 2012-07-16 LAB — CBC WITH DIFFERENTIAL/PLATELET
Basophils Absolute: 0.1 10*3/uL (ref 0.0–0.1)
Basophils Relative: 1 % (ref 0–1)
HCT: 37.7 % (ref 36.0–46.0)
MCHC: 31.8 g/dL (ref 30.0–36.0)
Monocytes Absolute: 0.8 10*3/uL (ref 0.1–1.0)
Neutro Abs: 2.9 10*3/uL (ref 1.7–7.7)
Neutrophils Relative %: 37 % — ABNORMAL LOW (ref 43–77)
Platelets: 259 10*3/uL (ref 150–400)
RDW: 13.8 % (ref 11.5–15.5)

## 2012-07-16 LAB — COMPREHENSIVE METABOLIC PANEL
AST: 13 U/L (ref 0–37)
Albumin: 4.1 g/dL (ref 3.5–5.2)
Alkaline Phosphatase: 40 U/L (ref 39–117)
BUN: 17 mg/dL (ref 6–23)
Potassium: 4 mEq/L (ref 3.5–5.3)

## 2012-07-16 NOTE — Addendum Note (Signed)
Addended by: Alben Spittle, Konrad Felix COX on: 07/16/2012 05:25 PM   Modules accepted: Orders

## 2012-08-07 ENCOUNTER — Ambulatory Visit: Payer: 59 | Admitting: Family Medicine

## 2012-08-08 ENCOUNTER — Ambulatory Visit: Payer: 59 | Admitting: Family Medicine

## 2012-08-12 ENCOUNTER — Ambulatory Visit (INDEPENDENT_AMBULATORY_CARE_PROVIDER_SITE_OTHER): Payer: 59 | Admitting: Family Medicine

## 2012-08-12 ENCOUNTER — Ambulatory Visit: Payer: 59 | Admitting: Family Medicine

## 2012-08-12 ENCOUNTER — Encounter: Payer: Self-pay | Admitting: Family Medicine

## 2012-08-12 VITALS — BP 100/67 | HR 58 | Temp 98.2°F | Resp 14 | Ht 62.0 in | Wt 137.8 lb

## 2012-08-12 DIAGNOSIS — R3129 Other microscopic hematuria: Secondary | ICD-10-CM

## 2012-08-12 DIAGNOSIS — R1013 Epigastric pain: Secondary | ICD-10-CM

## 2012-08-12 DIAGNOSIS — R52 Pain, unspecified: Secondary | ICD-10-CM

## 2012-08-12 DIAGNOSIS — K2970 Gastritis, unspecified, without bleeding: Secondary | ICD-10-CM | POA: Insufficient documentation

## 2012-08-12 LAB — URINALYSIS, ROUTINE W REFLEX MICROSCOPIC
Specific Gravity, Urine: 1.02 (ref 1.000–1.030)
Total Protein, Urine: NEGATIVE
Urine Glucose: NEGATIVE

## 2012-08-12 MED ORDER — FAMOTIDINE 40 MG PO TABS: 40.0000 mg | ORAL_TABLET | Freq: Every day | ORAL | Status: DC

## 2012-08-12 NOTE — Assessment & Plan Note (Signed)
Improving. Will actually have her switch to OTC PPI qd (prevacid or prilosec qAM) for 2 wks and she can then resume pepcid 40mg  qd prn. Will check h pylori igG today. Reassured pt that I don't think any further w/u needed at this time.  She knows to avoid NSAIDs as much as possible.

## 2012-08-12 NOTE — Assessment & Plan Note (Signed)
Unclear if she actually had a UTI. I'd like to get a UA with reflex microscopy today to follow this up.

## 2012-08-12 NOTE — Progress Notes (Signed)
OFFICE NOTE  08/12/2012  CC:  Chief Complaint  Patient presents with  . Follow-up    1-mth. [Epigastric Abdominal pain]     HPI: Patient is a 31 y.o. Caucasian female who is here for 1 mo f/u abd issues. Was seen 1 mo ago by Maximino Sarin, FNP--note and labs reviewed today. Pepcid 40mg  was started daily and patient reports that she has gradually improved.  Her episodes of epigastric burning are less severe and occur much less often now (was every other day approx, and now her most recent one was > 1 wk ago).   Her CBC and CMET last visit were normal.   There was question of UTI at that time b/c pt had diffuse lower back pain and a dipstick UA here showed trace LEU and moderate Hb.  However, she was not having any dysuria, gross hematuria, urinary urgency, or urinary frequency.  Her urine was not sent for culture, unfortunately (record shows it was ordered but then cancelled).  She was treated with nitrifurantoin and says her diffuse LBP did go away. LMP 07/22/12.  UPT was neg at last o/v.  Pertinent PMH:  Past Medical History  Diagnosis Date  . GAD (generalized anxiety disorder)     zoloft in the past helped but sexual side effects caused her to d/c it.   Past surgical, social, and family history reviewed and no changes noted since last office visit.  MEDS:  Outpatient Prescriptions Prior to Visit  Medication Sig Dispense Refill  . albuterol (VENTOLIN HFA) 108 (90 BASE) MCG/ACT inhaler Inhale 2 puffs into the lungs every 4 (four) hours as needed for wheezing.  1 Inhaler  0  . citalopram (CELEXA) 20 MG tablet Take 1 tablet (20 mg total) by mouth daily.  30 tablet  3  . clonazePAM (KLONOPIN) 1 MG tablet 1/2-1 tab po q12h prn anxiety  15 tablet  0  . famotidine (PEPCID) 40 MG tablet Take 1 tablet (40 mg total) by mouth daily.  30 tablet  1  . Multiple Vitamin (MULTIVITAMIN) tablet Take 1 tablet by mouth daily.      Lorita Officer Triphasic (ORTHO TRI-CYCLEN, 28, PO) Take 1  tablet by mouth daily.      . nitrofurantoin, macrocrystal-monohydrate, (MACROBID) 100 MG capsule Take 1 capsule (100 mg total) by mouth 2 (two) times daily.  14 capsule  0   No facility-administered medications prior to visit.    PE: Blood pressure 100/67, pulse 58, temperature 98.2 F (36.8 C), temperature source Oral, resp. rate 14, height 5\' 2"  (1.575 m), weight 137 lb 12 oz (62.483 kg), last menstrual period 07/22/2012, SpO2 97.00%. Gen: Alert, well appearing.  Patient is oriented to person, place, time, and situation. ENT: no icteris.  Oral mucosa pink and moist.  Throat w/out erythema. Neck - No masses or thyromegaly or limitation in range of motion CV: RRR, no m/r/g.   LUNGS: CTA bilat, nonlabored resps, good aeration in all lung fields. ABD: soft, nondistended.  She has mild TTP in subxyphoid region w/out guarding or rebound tenderness, but is otherwise nontender.  No mass or HSM.  LABS: none today  IMPRESSION AND PLAN:  Gastritis Improving. Will actually have her switch to OTC PPI qd (prevacid or prilosec qAM) for 2 wks and she can then resume pepcid 40mg  qd prn. Will check h pylori igG today. Reassured pt that I don't think any further w/u needed at this time.  She knows to avoid NSAIDs as much as possible.  Microhematuria Unclear if she actually had a UTI. I'd like to get a UA with reflex microscopy today to follow this up.   An After Visit Summary was printed and given to the patient.  FOLLOW UP: prn

## 2012-08-12 NOTE — Patient Instructions (Signed)
Buy OTC prilosec or prevacid (generic/store brand) and take 1 every morning for 2 wks. You may still take your pepcid as needed, even if you have already taken your prevacid or prilosec that day.

## 2012-08-14 ENCOUNTER — Ambulatory Visit: Payer: 59 | Admitting: Family Medicine

## 2012-09-02 ENCOUNTER — Encounter: Payer: Self-pay | Admitting: Family Medicine

## 2012-09-02 ENCOUNTER — Ambulatory Visit (INDEPENDENT_AMBULATORY_CARE_PROVIDER_SITE_OTHER): Payer: 59 | Admitting: Family Medicine

## 2012-09-02 VITALS — BP 90/54 | HR 55 | Temp 97.8°F | Ht 62.0 in | Wt 138.2 lb

## 2012-09-02 DIAGNOSIS — R3129 Other microscopic hematuria: Secondary | ICD-10-CM

## 2012-09-02 DIAGNOSIS — K2990 Gastroduodenitis, unspecified, without bleeding: Secondary | ICD-10-CM

## 2012-09-02 DIAGNOSIS — F411 Generalized anxiety disorder: Secondary | ICD-10-CM

## 2012-09-02 DIAGNOSIS — K2970 Gastritis, unspecified, without bleeding: Secondary | ICD-10-CM

## 2012-09-02 LAB — URINALYSIS, ROUTINE W REFLEX MICROSCOPIC
Bilirubin Urine: NEGATIVE
Ketones, ur: NEGATIVE mg/dL
Protein, ur: NEGATIVE mg/dL
Urobilinogen, UA: 0.2 mg/dL (ref 0.0–1.0)

## 2012-09-02 NOTE — Addendum Note (Signed)
Addended by: Baldemar Lenis R on: 09/02/2012 03:43 PM   Modules accepted: Orders

## 2012-09-02 NOTE — Progress Notes (Signed)
OFFICE NOTE  09/02/2012  CC:  Chief Complaint  Patient presents with  . Follow-up    3 month     HPI: Patient is a 32 y.o. Caucasian female who is here for f/u anxiety and also for recent microscopic hematuria on two separate occasions about 2 wks apart. No urine culture has been obtained on any of her specimen's that have been + for blood.  She has not had any sx's referrable to the urinary tract in either case (the initial specimen was obtained b/c of some generalized abd pains and low back pain, the second specimen was a f/u of the first). Denies gross hematuria.  GAD: overall doing well.  Has occ days where she feels a bit overwhelmed but doesn't take any klonopin. Gastritis and dyspepsia sx's have calmed down almost completely, just occ flare of dyspepsia sx's on anxious days or days when she eats lots of fruit.   Pertinent PMH:  Past Medical History  Diagnosis Date  . GAD (generalized anxiety disorder)     zoloft in the past helped but sexual side effects caused her to d/c it.  . Gastritis Mar/Apr 2014   Past surgical, social, and family history reviewed and no changes noted since last office visit.  MEDS:  Outpatient Prescriptions Prior to Visit  Medication Sig Dispense Refill  . albuterol (VENTOLIN HFA) 108 (90 BASE) MCG/ACT inhaler Inhale 2 puffs into the lungs every 4 (four) hours as needed for wheezing.  1 Inhaler  0  . citalopram (CELEXA) 20 MG tablet Take 1 tablet (20 mg total) by mouth daily.  30 tablet  3  . clonazePAM (KLONOPIN) 1 MG tablet 1/2-1 tab po q12h prn anxiety  15 tablet  0  . famotidine (PEPCID) 40 MG tablet Take 1 tablet (40 mg total) by mouth daily.  30 tablet  1  . Multiple Vitamin (MULTIVITAMIN) tablet Take 1 tablet by mouth daily.      Lorita Officer Triphasic (ORTHO TRI-CYCLEN, 28, PO) Take 1 tablet by mouth daily.       No facility-administered medications prior to visit.    PE: Blood pressure 90/54, pulse 55, temperature 97.8 F  (36.6 C), temperature source Oral, height 5\' 2"  (1.575 m), weight 138 lb 4 oz (62.71 kg), last menstrual period 08/20/2012, SpO2 96.00%. Gen: Alert, well appearing.  Patient is oriented to person, place, time, and situation. No further exam today.  IMPRESSION AND PLAN:  1) GAD: Problem stable.  Continue current medications and diet appropriate for this condition.  We have reviewed our general long term plan for this problem and also reviewed symptoms and signs that should prompt the patient to call or return to the office.   Emphasized that it was ok to take a clonazepam on her occ overwhelming days.  2) Recent gastritis/dyspepsia: MUCH improved.  She'll continue pepcid prn.  3) Microscopic hematuria: will send one final urine and get a culture with this one to 100% exclude infection as the cause (although it is very likely not the cause since she has no UTI sx's). If clx neg (whether or not UA still shows microhematuria), we'll proceed with renal u/s and then refer to urology.  An After Visit Summary was printed and given to the patient.  FOLLOW UP: 25mo f/u GAD, dyspepsia

## 2012-09-03 ENCOUNTER — Ambulatory Visit: Payer: 59 | Admitting: Family Medicine

## 2012-09-03 LAB — URINALYSIS, MICROSCOPIC ONLY: Bacteria, UA: NONE SEEN

## 2012-09-03 LAB — URINE CULTURE

## 2012-09-04 ENCOUNTER — Other Ambulatory Visit: Payer: Self-pay | Admitting: Family Medicine

## 2012-09-04 DIAGNOSIS — R3129 Other microscopic hematuria: Secondary | ICD-10-CM

## 2012-09-09 ENCOUNTER — Ambulatory Visit (HOSPITAL_COMMUNITY): Payer: 59

## 2012-09-15 ENCOUNTER — Ambulatory Visit (HOSPITAL_COMMUNITY)
Admission: RE | Admit: 2012-09-15 | Discharge: 2012-09-15 | Disposition: A | Discharge status: Home / Self Care | Payer: 59 | Source: Ambulatory Visit | Attending: Family Medicine | Admitting: Family Medicine

## 2012-09-15 DIAGNOSIS — R3129 Other microscopic hematuria: Secondary | ICD-10-CM | POA: Insufficient documentation

## 2012-09-16 ENCOUNTER — Other Ambulatory Visit: Payer: Self-pay | Admitting: Family Medicine

## 2012-09-16 DIAGNOSIS — R3129 Other microscopic hematuria: Secondary | ICD-10-CM

## 2012-11-12 ENCOUNTER — Other Ambulatory Visit: Payer: Self-pay | Admitting: *Deleted

## 2012-11-13 MED ORDER — CITALOPRAM HYDROBROMIDE 20 MG PO TABS: 20.0000 mg | ORAL_TABLET | Freq: Every day | ORAL | Status: DC

## 2012-11-13 NOTE — Telephone Encounter (Signed)
Refill request for Celexa Last filled by MD on - 06/10/12 #30 x3 Last Seen- 56/10/14 Next Appt: 03/03/13 Please advise refill?

## 2012-11-25 ENCOUNTER — Encounter: Payer: Self-pay | Admitting: Family Medicine

## 2012-12-30 ENCOUNTER — Encounter: Payer: Self-pay | Admitting: Family Medicine

## 2013-01-29 ENCOUNTER — Other Ambulatory Visit: Payer: Self-pay

## 2013-03-03 ENCOUNTER — Encounter: Payer: Self-pay | Admitting: Family Medicine

## 2013-03-03 ENCOUNTER — Ambulatory Visit (INDEPENDENT_AMBULATORY_CARE_PROVIDER_SITE_OTHER): Payer: 59 | Admitting: Family Medicine

## 2013-03-03 VITALS — BP 109/67 | HR 81 | Temp 98.3°F | Resp 18 | Ht 63.0 in | Wt 142.0 lb

## 2013-03-03 DIAGNOSIS — F411 Generalized anxiety disorder: Secondary | ICD-10-CM

## 2013-03-03 DIAGNOSIS — H6980 Other specified disorders of Eustachian tube, unspecified ear: Secondary | ICD-10-CM | POA: Insufficient documentation

## 2013-03-03 DIAGNOSIS — H6982 Other specified disorders of Eustachian tube, left ear: Secondary | ICD-10-CM

## 2013-03-03 MED ORDER — CLONAZEPAM 1 MG PO TABS: ORAL_TABLET | ORAL | Status: DC

## 2013-03-03 NOTE — Progress Notes (Signed)
OFFICE NOTE  03/03/2013  CC:  Chief Complaint  Patient presents with  . Follow-up     HPI: Patient is a 32 y.o. Caucasian female who is here for 6 mo f/u GAD. Anxiety well controlled.  Takes a clonaz pill once every few weeks at the most--usually to help her sleep on a bad day or when having extra anxiety. No side effects from citalopram.  Compliant with this med daily.  New problem: had a URI a couple of weeks ago and during and after the URI she has had intermittent discomfort in left ear, some muffling of sound intermittently, and she wonders if it is due to an eardrum problem (she has past hx of ruptured TM from AOM about 1 yr ago). No fevers, no ear drainage.  No longer having nasal congestion or runny nose.    Pertinent PMH:  Past Medical History  Diagnosis Date  . GAD (generalized anxiety disorder)     zoloft in the past helped but sexual side effects caused her to d/c it.  . Gastritis Mar/Apr 2014  . Microscopic hematuria     Exam, labs, cysto, and imaging all UNREMARKABLE with Dr. Berneice Heinrich all NEG--pt to get further eval only if gross hematuria occurs.    MEDS:  Outpatient Prescriptions Prior to Visit  Medication Sig Dispense Refill  . citalopram (CELEXA) 20 MG tablet Take 1 tablet (20 mg total) by mouth daily.  30 tablet  6  . clonazePAM (KLONOPIN) 1 MG tablet 1/2-1 tab po q12h prn anxiety  15 tablet  0  . Multiple Vitamin (MULTIVITAMIN) tablet Take 1 tablet by mouth daily.      Lorita Officer Triphasic (ORTHO TRI-CYCLEN, 28, PO) Take 1 tablet by mouth daily.      Marland Kitchen albuterol (VENTOLIN HFA) 108 (90 BASE) MCG/ACT inhaler Inhale 2 puffs into the lungs every 4 (four) hours as needed for wheezing.  1 Inhaler  0  . famotidine (PEPCID) 40 MG tablet Take 1 tablet (40 mg total) by mouth daily.  30 tablet  1   No facility-administered medications prior to visit.    PE: Blood pressure 109/67, pulse 81, temperature 98.3 F (36.8 C), temperature source Temporal, resp.  rate 18, height 5\' 3"  (1.6 m), weight 142 lb (64.411 kg), last menstrual period 02/04/2013, SpO2 96.00%. Gen: Alert, well appearing.  Patient is oriented to person, place, time, and situation. ENT: Ears: EACs clear, normal epithelium.  TMs with good light reflex and landmarks bilaterally.  Eyes: no injection, icteris, swelling, or exudate.  EOMI, PERRLA. Nose: no drainage or turbinate edema/swelling.  No injection or focal lesion.  Mouth: lips without lesion/swelling.  Oral mucosa pink and moist.  Dentition intact and without obvious caries or gingival swelling.  Oropharynx without erythema, exudate, or swelling.    IMPRESSION AND PLAN:  1) GAD: The current medical regimen is effective;  continue present plan and medications. Clonazepam rx renewed for #15, RF x 1.  2) Eustachian tube dysfxn: discussed/explained dx.  Reassured pt. Recommended blowing against closed nostrils a few times a day.  Also saline nasal spray a few times a day.  Transient nature of this problem was discussed with pt.  FOLLOW UP:  6 mo for CPE (GYN not included)--fasting

## 2013-03-31 ENCOUNTER — Telehealth: Payer: Self-pay | Admitting: Family Medicine

## 2013-03-31 NOTE — Telephone Encounter (Signed)
Patient Information:  Caller Name: Takeria  Phone: [phone]  Patient: Heather Conley, Heather Conley  Gender: Female  DOB: April 07, 1980  Age: 33 Years  PCP: Ricardo Jericho [facility])  Pregnant: No  Office Follow Up:  Does the office need to follow up with this patient?: No  Instructions For The Office: N/A  RN Note:  will try Mucinex DM during the day and Nyquil at night  Symptoms  Reason For Call & Symptoms: c/o deep, nonproductive cough; has drainage going down her throat that is causing a sore throat; feels like the drainage is caught in the back of her throat; sxs worsen when she lays down  Reviewed Health History In EMR: Yes  Reviewed Medications In EMR: Yes  Reviewed Allergies In EMR: Yes  Reviewed Surgeries / Procedures: Yes  Date of Onset of Symptoms: 03/30/2013 OB / GYN:  LMP: 03/04/2013  Guideline(s) Used:  Cough  Disposition Per Guideline:   Home Care  Reason For Disposition Reached:   Cough with no complications  Advice Given:  Reassurance  Coughing is the way that our lungs remove irritants and mucus. It helps protect our lungs from getting pneumonia.  You can get a dry hacking cough after a chest cold. Sometimes this type of cough can last 1-3 weeks, and be worse at night.  You can also get a cough after being exposed to irritating substances like smoke, strong perfumes, and dust.  Cough Medicines:  OTC Cough Syrups: The most common cough suppressant in OTC cough medications is dextromethorphan. Often the letters "DM" appear in the name.  OTC Cough Drops: Cough drops can help a lot, especially for mild coughs. They reduce coughing by soothing your irritated throat and removing that tickle sensation in the back of the throat. Cough drops also have the advantage of portability - you can carry them with you.  Home Remedy - Hard Candy: Hard candy works just as well as medicine-flavored OTC cough drops. Diabetics should use sugar-free candy.  Home Remedy - Honey: This  old home remedy has been shown to help decrease coughing at night. The adult dosage is 2 teaspoons (10 ml) at bedtime. Honey should not be given to infants under one year of age.  OTC Cough Syrup - Dextromethorphan:  Cough syrups containing the cough suppressant dextromethorphan (DM) may help decrease your cough. Cough syrups work best for coughs that keep you awake at night. They can also sometimes help in the late stages of a respiratory infection when the cough is dry and hacking. They can be used along with cough drops.  Coughing Spasms:  Drink warm fluids. Inhale warm mist (Reason: both relax the airway and loosen up the phlegm).  Suck on cough drops or hard candy to coat the irritated throat.  Prevent Dehydration:  Drink adequate liquids.  This will help soothe an irritated or dry throat and loosen up the phlegm.  Avoid Tobacco Smoke:  Smoking or being exposed to smoke makes coughs much worse.  Expected Course:   The expected course depends on what is causing the cough.  Viral bronchitis (chest cold) causes a cough that lasts 1 to 3 weeks. Sometimes you may cough up lots of phlegm (sputum, mucus). The mucus can normally be white, gray, yellow, or green.  Call Back If:  Difficulty breathing  Cough lasts more than 3 weeks  Fever lasts > 3 days  You become worse.  Patient Will Follow Care Advice:  YES

## 2013-04-03 ENCOUNTER — Ambulatory Visit (INDEPENDENT_AMBULATORY_CARE_PROVIDER_SITE_OTHER): Payer: 59 | Admitting: Family Medicine

## 2013-04-03 ENCOUNTER — Ambulatory Visit: Payer: 59 | Admitting: Nurse Practitioner

## 2013-04-03 ENCOUNTER — Encounter: Payer: Self-pay | Admitting: Family Medicine

## 2013-04-03 VITALS — BP 104/70 | HR 99 | Temp 98.2°F | Resp 18 | Ht 63.0 in | Wt 145.0 lb

## 2013-04-03 DIAGNOSIS — J029 Acute pharyngitis, unspecified: Secondary | ICD-10-CM

## 2013-04-03 DIAGNOSIS — R059 Cough, unspecified: Secondary | ICD-10-CM

## 2013-04-03 DIAGNOSIS — J069 Acute upper respiratory infection, unspecified: Secondary | ICD-10-CM

## 2013-04-03 DIAGNOSIS — R05 Cough: Secondary | ICD-10-CM

## 2013-04-03 LAB — POCT RAPID STREP A (OFFICE): RAPID STREP A SCREEN: NEGATIVE

## 2013-04-03 NOTE — Progress Notes (Signed)
OFFICE NOTE  04/03/2013  CC:  Chief Complaint  Patient presents with  . Nasal Congestion    x monday off and on  . Sore Throat     HPI: Patient is a 33 y.o. Caucasian female who is here for sore throat. Onset 4 d/a with scratchy throat, then ST with PND, nasal congestion, some face/sinus pressure and peri-orbital HA, fatigue.  No fevers.  Actually felt a bit improved for about 24h.  Lots of "deep, nonproductive cough".  Upper back and neck discomfort/ache. No n/v/d.  Recent sick contact with strep throat.  Pertinent PMH:  Past Medical History  Diagnosis Date  . GAD (generalized anxiety disorder)     zoloft in the past helped but sexual side effects caused her to d/c it.  . Gastritis Mar/Apr 2014  . Microscopic hematuria     Exam, labs, cysto, and imaging all UNREMARKABLE with Dr. Tresa Moore all NEG--pt to get further eval only if gross hematuria occurs.   Past surgical, social, and family history reviewed and no changes noted since last office visit.  MEDS:  Outpatient Prescriptions Prior to Visit  Medication Sig Dispense Refill  . citalopram (CELEXA) 20 MG tablet Take 1 tablet (20 mg total) by mouth daily.  30 tablet  6  . clonazePAM (KLONOPIN) 1 MG tablet 1/2-1 tab po q12h prn anxiety  15 tablet  1  . Multiple Vitamin (MULTIVITAMIN) tablet Take 1 tablet by mouth daily.      Lenard Forth Triphasic (ORTHO TRI-CYCLEN, 28, PO) Take 1 tablet by mouth daily.      Marland Kitchen albuterol (VENTOLIN HFA) 108 (90 BASE) MCG/ACT inhaler Inhale 2 puffs into the lungs every 4 (four) hours as needed for wheezing.  1 Inhaler  0  . famotidine (PEPCID) 40 MG tablet Take 1 tablet (40 mg total) by mouth daily.  30 tablet  1   No facility-administered medications prior to visit.    PE: Blood pressure 104/70, pulse 99, temperature 98.2 F (36.8 C), temperature source Temporal, resp. rate 18, height 5\' 3"  (1.6 m), weight 145 lb (65.772 kg), last menstrual period 04/02/2013, SpO2 99.00%. VS:  noted--normal. Gen: alert, NAD, NONTOXIC APPEARING. HEENT: eyes without injection, drainage, or swelling.  Ears: EACs clear, TMs with normal light reflex and landmarks.  Nose: Clear rhinorrhea, with some dried, crusty exudate adherent to mildly injected mucosa.  No purulent d/c.  No paranasal sinus TTP.  No facial swelling.  Throat and mouth without focal lesion.  No pharyngial swelling, erythema, or exudate.   Neck: supple, no LAD.   LUNGS: CTA bilat, nonlabored resps.   CV: RRR, no m/r/g. EXT: no c/c/e SKIN: no rash  LAB: Rapid strep negative  IMPRESSION AND PLAN:  Viral URI. Trial of mucinex DM or robitussin DM otc as directed on the box. May use OTC nasal saline spray or irrigation solution bid. OTC nonsedating antihistamines prn discussed.  Decongestant use discussed--ok if tolerated in the past w/out side effect and if pt has no hx of HTN. Throat culture sent today.  An After Visit Summary was printed and given to the patient.  FOLLOW UP: prn

## 2013-04-05 LAB — CULTURE, GROUP A STREP: Organism ID, Bacteria: NORMAL

## 2013-06-24 DIAGNOSIS — M26609 Unspecified temporomandibular joint disorder, unspecified side: Secondary | ICD-10-CM

## 2013-06-24 HISTORY — DX: Unspecified temporomandibular joint disorder, unspecified side: M26.609

## 2013-07-01 ENCOUNTER — Other Ambulatory Visit: Payer: Self-pay | Admitting: Family Medicine

## 2013-07-13 ENCOUNTER — Encounter: Payer: Self-pay | Admitting: Family Medicine

## 2013-07-13 ENCOUNTER — Ambulatory Visit (INDEPENDENT_AMBULATORY_CARE_PROVIDER_SITE_OTHER): Payer: 59 | Admitting: Family Medicine

## 2013-07-13 VITALS — BP 115/75 | HR 87 | Temp 98.8°F | Resp 18 | Ht 63.0 in | Wt 147.0 lb

## 2013-07-13 DIAGNOSIS — J4530 Mild persistent asthma, uncomplicated: Secondary | ICD-10-CM | POA: Insufficient documentation

## 2013-07-13 DIAGNOSIS — J45909 Unspecified asthma, uncomplicated: Secondary | ICD-10-CM

## 2013-07-13 DIAGNOSIS — J309 Allergic rhinitis, unspecified: Secondary | ICD-10-CM

## 2013-07-13 DIAGNOSIS — J209 Acute bronchitis, unspecified: Secondary | ICD-10-CM

## 2013-07-13 MED ORDER — ALBUTEROL SULFATE HFA 108 (90 BASE) MCG/ACT IN AERS: 2.0000 | INHALATION_SPRAY | RESPIRATORY_TRACT | Status: DC | PRN

## 2013-07-13 MED ORDER — HYDROCODONE-HOMATROPINE 5-1.5 MG/5ML PO SYRP: ORAL_SOLUTION | ORAL | Status: DC

## 2013-07-13 MED ORDER — PREDNISONE 20 MG PO TABS: ORAL_TABLET | ORAL | Status: DC

## 2013-07-13 MED ORDER — FLUTICASONE PROPIONATE 50 MCG/ACT NA SUSP: 2.0000 | Freq: Every day | NASAL | Status: DC

## 2013-07-13 NOTE — Progress Notes (Signed)
OFFICE NOTE  07/13/2013  CC:  Chief Complaint  Patient presents with  . Cough    dry cough, worse at night  . Otalgia    left ear      HPI: Patient is a 33 y.o. Caucasian female who is here for dry cough. Onset weeks ago with nasal congestion/runny nose, ST, and cough then the nasal sx's and ST eventually resolved and dry cough persisted.  Chest tightness/heaviness but no SOB.  No fevers.  No HA. No ST. Left TMJ pain + referred pain to left ear is worse lately.   She is working long hours lately, not getting much sleep. Tried mucinex--dried her out.  Zyrtec occ, claritin occ. No cough suppressant.  No inhaler use: she can't find it.   Pertinent PMH:  Past medical, surgical, social, and family history reviewed and no changes are noted since last office visit.  MEDS:  Outpatient Prescriptions Prior to Visit  Medication Sig Dispense Refill  . citalopram (CELEXA) 20 MG tablet TAKE 1 TABLET BY MOUTH DAILY.  30 tablet  1  . clonazePAM (KLONOPIN) 1 MG tablet 1/2-1 tab po q12h prn anxiety  15 tablet  1  . Multiple Vitamin (MULTIVITAMIN) tablet Take 1 tablet by mouth daily.      Lenard Forth Triphasic (ORTHO TRI-CYCLEN, 28, PO) Take 1 tablet by mouth daily.      Marland Kitchen albuterol (VENTOLIN HFA) 108 (90 BASE) MCG/ACT inhaler Inhale 2 puffs into the lungs every 4 (four) hours as needed for wheezing.  1 Inhaler  0  . famotidine (PEPCID) 40 MG tablet Take 1 tablet (40 mg total) by mouth daily.  30 tablet  1   No facility-administered medications prior to visit.    PE: Blood pressure 115/75, pulse 87, temperature 98.8 F (37.1 C), temperature source Temporal, resp. rate 18, height 5\' 3"  (1.6 m), weight 147 lb (66.679 kg), SpO2 97.00%. VS: noted--normal. Gen: alert, NAD, well- APPEARING. HEENT: eyes without injection, drainage, or swelling.  Ears: EACs clear, TMs with normal light reflex and landmarks.  Nose: Clear rhinorrhea, with some dried, crusty exudate adherent to mildly  injected mucosa.  No purulent d/c.  No paranasal sinus TTP.  No facial swelling.  Throat and mouth without focal lesion.  No pharyngial swelling, erythema, or exudate.  + Post pharynx with cobblestoning of the mucosa. Neck: supple, no LAD.   LUNGS: CTA bilat, nonlabored resps.  Mildly diminished aeration with forced expiratory maneuver today.    CV: RRR, no m/r/g. EXT: no c/c/e SKIN: no rash  LAB: none  IMPRESSION AND PLAN:  Allergic rhinitis vs viral URI, with mild asthmatic bronchitis. Discussed treatment options with pt. Decided to rx prednisone 40mg  qd x 5d.  Also add flonase qAM to her daily nonsedating/otc antihistamine.  Ventolin HFA rx renewed today. Hycodan susp, 1-2 tsp q6h prn cough, #211ml, no RF--rx handed to patient today. Therapeutic expectations and side effect profile of medication discussed today.  Patient's questions answered. 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  It is a little late this spring, but next spring I think she would benefit from starting inhaled steroid (pulmicort or flovent or QVAR) towards the end of February, b/c she says this is a recurring type illness she gets in spring since moving to [facility].  FOLLOW UP: prn

## 2013-07-13 NOTE — Progress Notes (Signed)
Pre visit review using our clinic review tool, if applicable. No additional management support is needed unless otherwise documented below in the visit note. 

## 2013-07-20 ENCOUNTER — Telehealth: Payer: Self-pay | Admitting: Family Medicine

## 2013-07-20 MED ORDER — AZITHROMYCIN 250 MG PO TABS: ORAL_TABLET | ORAL | Status: DC

## 2013-07-20 NOTE — Telephone Encounter (Signed)
Patient has finished her prednisone. She has noticed her lymph nodes have become swollen. The right side is bigger than the left. She still has the dry cough. What do you recommend? Does she need another appointment?

## 2013-07-20 NOTE — Telephone Encounter (Signed)
Spoke with pt, advised Rx was sent to her pharmacy. Pt understood. 

## 2013-07-20 NOTE — Telephone Encounter (Signed)
I'll send in rx for azithromycin x 5d. Tell her that if she is not significantly improved in 10 then she'll need to f/u in office. -thx

## 2013-07-22 ENCOUNTER — Encounter: Payer: Self-pay | Admitting: Family Medicine

## 2013-07-23 ENCOUNTER — Ambulatory Visit: Payer: 59 | Admitting: Family Medicine

## 2013-07-27 ENCOUNTER — Other Ambulatory Visit: Payer: Self-pay | Admitting: Family Medicine

## 2013-07-28 ENCOUNTER — Encounter: Payer: 59 | Admitting: Family Medicine

## 2013-08-04 ENCOUNTER — Ambulatory Visit (INDEPENDENT_AMBULATORY_CARE_PROVIDER_SITE_OTHER): Payer: 59 | Admitting: Family Medicine

## 2013-08-04 ENCOUNTER — Encounter: Payer: Self-pay | Admitting: Family Medicine

## 2013-08-04 VITALS — BP 109/74 | HR 74 | Temp 98.9°F | Resp 16 | Ht 63.0 in | Wt 145.0 lb

## 2013-08-04 DIAGNOSIS — Z0000 Encounter for general adult medical examination without abnormal findings: Secondary | ICD-10-CM

## 2013-08-04 LAB — LIPID PANEL
Cholesterol: 204 mg/dL — ABNORMAL HIGH (ref 0–200)
HDL: 81.5 mg/dL (ref >39.00)
LDL Cholesterol: 105 mg/dL — ABNORMAL HIGH (ref 0–99)
Total CHOL/HDL Ratio: 3
Triglycerides: 88 mg/dL (ref 0.0–149.0)
VLDL: 17.6 mg/dL (ref 0.0–40.0)

## 2013-08-04 LAB — COMPREHENSIVE METABOLIC PANEL
ALBUMIN: 3.8 g/dL (ref 3.5–5.2)
ALT: 11 U/L (ref 0–35)
AST: 14 U/L (ref 0–37)
Alkaline Phosphatase: 36 U/L — ABNORMAL LOW (ref 39–117)
BUN: 15 mg/dL (ref 6–23)
CHLORIDE: 106 meq/L (ref 96–112)
CO2: 28 mEq/L (ref 19–32)
CREATININE: 0.6 mg/dL (ref 0.4–1.2)
Calcium: 9 mg/dL (ref 8.4–10.5)
GFR: 115.04 mL/min (ref >60.00)
Glucose, Bld: 88 mg/dL (ref 70–99)
POTASSIUM: 4.3 meq/L (ref 3.5–5.1)
SODIUM: 138 meq/L (ref 135–145)
Total Bilirubin: 0.8 mg/dL (ref 0.2–1.2)
Total Protein: 6.4 g/dL (ref 6.0–8.3)

## 2013-08-04 LAB — CBC WITH DIFFERENTIAL/PLATELET
Basophils Absolute: 0 10*3/uL (ref 0.0–0.1)
Basophils Relative: 0.7 % (ref 0.0–3.0)
EOS ABS: 0.3 10*3/uL (ref 0.0–0.7)
Eosinophils Relative: 4.8 % (ref 0.0–5.0)
HCT: 40.7 % (ref 36.0–46.0)
Hemoglobin: 13.3 g/dL (ref 12.0–15.0)
LYMPHS PCT: 35.3 % (ref 12.0–46.0)
Lymphs Abs: 2 10*3/uL (ref 0.7–4.0)
MCHC: 32.7 g/dL (ref 30.0–36.0)
MCV: 86.8 fl (ref 78.0–100.0)
Monocytes Absolute: 0.6 10*3/uL (ref 0.1–1.0)
Monocytes Relative: 10.6 % (ref 3.0–12.0)
NEUTROS PCT: 48.6 % (ref 43.0–77.0)
Neutro Abs: 2.8 10*3/uL (ref 1.4–7.7)
PLATELETS: 255 10*3/uL (ref 150.0–400.0)
RBC: 4.69 Mil/uL (ref 3.87–5.11)
RDW: 14.3 % (ref 11.5–15.5)
WBC: 5.7 10*3/uL (ref 4.0–10.5)

## 2013-08-04 LAB — TSH: TSH: 1.59 u[IU]/mL (ref 0.35–4.50)

## 2013-08-04 MED ORDER — CLONAZEPAM 1 MG PO TABS: ORAL_TABLET | ORAL | Status: DC

## 2013-08-04 NOTE — Progress Notes (Signed)
Office Note 08/04/2013  CC:  Chief Complaint  Patient presents with  . Annual Exam    fasting  . medication management    HPI:  Heather Conley is a 33 y.o. White female who is here for CPE-fasting. Gets paps/pelvics via GYN.  Still with some dry cough, started omeprazole 40mg  qd per ENT 1 wk ago. Thinks allergies are contributing.  Says she otherwise feels fine.  Going to dentist to get custom fit mouth guard (Dr. Armandina Gemma in [place]) for her TMJ that causes lots of left ear pain.  Wants to try weening herself off celexa to see if wt loss becomes easier. She works out 4-5 d/week and eats prudent diet.   Past Medical History  Diagnosis Date  . GAD (generalized anxiety disorder)     zoloft in the past helped but sexual side effects caused her to d/c it.  . Gastritis Mar/Apr 2014  . Microscopic hematuria     Exam, labs, cysto, and imaging all UNREMARKABLE with Dr. Tresa Moore all NEG--pt to get further eval only if gross hematuria occurs.  Marland Kitchen GERD (gastroesophageal reflux disease)     chronic cough--eval by Dr. Shoemaker-ENT-07/22/13.  . 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 Wisdom tooth extraction  approx 9675    No complications    Family History  Problem Relation Age of Onset  . Alcohol abuse Mother   . Heart disease Maternal Uncle   . Heart disease Maternal Grandfather   . Stroke Paternal Grandmother     History   Social History  . Marital Status: Single    Spouse Name: N/A    Number of Children: N/A  . Years of Education: N/A   Occupational History  . Not on file.   Social History Main Topics  . Smoking status: Never Smoker   . Smokeless tobacco: Never Used  . Alcohol Use: Yes     Comment: social  . Drug Use: No  . Sexual Activity: Not on file   Other Topics Concern  . Not on file   Social History Narrative   Single, no children.  Lives with fiance in [place].   Orig from [place].      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   No tobacco, occas alcohol, no drugs.   Exercises 3-5 days per week.   Normal diet.         MEDS: taking omeprazole once daily 40mg  per ENT. Not taking hycodan, azithromycin, pepcid, or prednisone listed below  Outpatient Prescriptions Prior to Visit  Medication Sig Dispense Refill  . albuterol (VENTOLIN HFA) 108 (90 BASE) MCG/ACT inhaler Inhale 2 puffs into the lungs every 4 (four) hours as needed for wheezing.  1 Inhaler  0  . citalopram (CELEXA) 20 MG tablet TAKE 1 TABLET DAILY  30 tablet  0  . fluticasone (FLONASE) 50 MCG/ACT nasal spray Place 2 sprays into both nostrils daily.  16 g  6  . Multiple Vitamin (MULTIVITAMIN) tablet Take 1 tablet by mouth daily.      Lenard Forth Triphasic (ORTHO TRI-CYCLEN, 28, PO) Take 1 tablet by mouth daily.      . clonazePAM (KLONOPIN) 1 MG tablet 1/2-1 tab po q12h prn anxiety  15 tablet  1  . famotidine (PEPCID) 40 MG tablet Take 1 tablet (40 mg total) by mouth daily.  30 tablet  1  .  azithromycin (ZITHROMAX) 250 MG tablet 2 tabs po qd x 1d, then 1 tab po qd x 4d  6 each  0  . HYDROcodone-homatropine (HYCODAN) 5-1.5 MG/5ML syrup 1-2 tsp po q6h prn cough  240 mL  0  . predniSONE (DELTASONE) 20 MG tablet 2 tabs po qd x 5d  10 tablet  0   No facility-administered medications prior to visit.    Allergies  Allergen Reactions  . Sulfa Antibiotics     ROS Review of Systems  Constitutional: Negative for fever, chills, appetite change and fatigue.  HENT: Negative for congestion, dental problem, ear pain and sore throat.   Eyes: Negative for discharge, redness and visual disturbance.  Respiratory: Positive for cough (nonproductive, chronic). Negative for chest tightness, shortness of breath and wheezing.   Cardiovascular: Negative for chest pain, palpitations and leg swelling.  Gastrointestinal: Negative for nausea, vomiting, abdominal pain, diarrhea and blood in stool.   Genitourinary: Negative for dysuria, urgency, frequency, hematuria, flank pain and difficulty urinating.  Musculoskeletal: Negative for arthralgias, back pain, joint swelling, myalgias and neck stiffness.  Skin: Negative for pallor and rash.  Neurological: Negative for dizziness, speech difficulty, weakness and headaches.  Hematological: Negative for adenopathy. Does not bruise/bleed easily.  Psychiatric/Behavioral: Negative for confusion and sleep disturbance. The patient is not nervous/anxious.     PE; Blood pressure 109/74, pulse 74, temperature 98.9 F (37.2 C), temperature source Temporal, resp. rate 16, height 5\' 3"  (1.6 m), weight 145 lb (65.772 kg), last menstrual period 07/14/2013, SpO2 96.00%. Gen: Alert, well appearing.  Patient is oriented to person, place, time, and situation. AFFECT: pleasant, lucid thought and speech. ENT: Ears: EACs clear, normal epithelium.  TMs with good light reflex and landmarks bilaterally.  Eyes: no injection, icteris, swelling, or exudate.  EOMI, PERRLA. Nose: no drainage or turbinate edema/swelling.  No injection or focal lesion.  Mouth: lips without lesion/swelling.  Oral mucosa pink and moist.  Dentition intact and without obvious caries or gingival swelling.  Oropharynx without erythema, exudate, or swelling.  Neck: supple/nontender.  No LAD, mass, or TM.  Carotid pulses 2+ bilaterally, without bruits. CV: RRR, no m/r/g.   LUNGS: CTA bilat, nonlabored resps, good aeration in all lung fields. ABD: soft, NT, ND, BS normal.  No hepatospenomegaly or mass.  No bruits. EXT: no clubbing, cyanosis, or edema.  Musculoskeletal: no joint swelling, erythema, warmth, or tenderness.  ROM of all joints intact. Skin - no sores or suspicious lesions or rashes or color changes  Pertinent labs:  none  ASSESSMENT AND PLAN:   Health maintenance examination Reviewed age and gender appropriate health maintenance issues (prudent diet, regular exercise, health risks  of tobacco and excessive alcohol, use of seatbelts, fire alarms in home, use of sunscreen).  Also reviewed age and gender appropriate health screening as well as vaccine recommendations.  Regarding anxiety: she feels stable and due to problems losing weight she wants to try ween off of citalopram: Take 1/2 of 20mg  citalopram once daily x 2 wks, then 1/4 of 20mg  citalopram once daily x 2 wks, then stop. Use clonazepam up to three times a day as needed for anxiety.   An After Visit Summary was printed and given to the patient.  FOLLOW UP:  Return if symptoms worsen or fail to improve.

## 2013-08-04 NOTE — Progress Notes (Signed)
Pre visit review using our clinic review tool, if applicable. No additional management support is needed unless otherwise documented below in the visit note. 

## 2013-08-04 NOTE — Assessment & Plan Note (Addendum)
Reviewed age and gender appropriate health maintenance issues (prudent diet, regular exercise, health risks of tobacco and excessive alcohol, use of seatbelts, fire alarms in home, use of sunscreen).  Also reviewed age and gender appropriate health screening as well as vaccine recommendations.  Regarding anxiety: she feels stable and due to problems losing weight she wants to try ween off of citalopram: Take 1/2 of 20mg  citalopram once daily x 2 wks, then 1/4 of 20mg  citalopram once daily x 2 wks, then stop. Use clonazepam up to three times a day as needed for anxiety.

## 2013-08-04 NOTE — Patient Instructions (Signed)
Take 1/2 of 20mg  citalopram once daily x 2 wks, then 1/4 of 20mg  citalopram once daily x 2 wks, then stop. Use clonazepam up to three times a day as needed for anxiety.

## 2013-09-28 ENCOUNTER — Other Ambulatory Visit: Payer: Self-pay | Admitting: Family Medicine

## 2013-09-28 NOTE — Telephone Encounter (Signed)
Clonazepam rx printed. 

## 2013-09-28 NOTE — Telephone Encounter (Signed)
[facility]

## 2013-09-28 NOTE — Telephone Encounter (Signed)
Last office visit 08/04/2013. Last refill 08/04/2013

## 2013-10-09 ENCOUNTER — Other Ambulatory Visit: Payer: Self-pay | Admitting: Family Medicine

## 2013-10-09 MED ORDER — CITALOPRAM HYDROBROMIDE 20 MG PO TABS: ORAL_TABLET | ORAL | Status: DC

## 2014-04-28 ENCOUNTER — Ambulatory Visit: Payer: 59 | Admitting: Family Medicine

## 2014-04-29 ENCOUNTER — Ambulatory Visit: Payer: 59 | Admitting: Family Medicine

## 2014-04-30 ENCOUNTER — Ambulatory Visit (INDEPENDENT_AMBULATORY_CARE_PROVIDER_SITE_OTHER): Payer: 59 | Admitting: Family Medicine

## 2014-04-30 ENCOUNTER — Encounter: Payer: Self-pay | Admitting: Family Medicine

## 2014-04-30 VITALS — BP 117/75 | HR 91 | Temp 98.4°F | Resp 18 | Ht 63.0 in | Wt 145.0 lb

## 2014-04-30 DIAGNOSIS — IMO0001 Reserved for inherently not codable concepts without codable children: Secondary | ICD-10-CM

## 2014-04-30 DIAGNOSIS — R1013 Epigastric pain: Secondary | ICD-10-CM

## 2014-04-30 DIAGNOSIS — R143 Flatulence: Secondary | ICD-10-CM

## 2014-04-30 DIAGNOSIS — K2100 Gastro-esophageal reflux disease with esophagitis, without bleeding: Secondary | ICD-10-CM

## 2014-04-30 LAB — CBC WITH DIFFERENTIAL/PLATELET
BASOS ABS: 0 10*3/uL (ref 0.0–0.1)
BASOS PCT: 0.6 % (ref 0.0–3.0)
Eosinophils Absolute: 0.2 10*3/uL (ref 0.0–0.7)
Eosinophils Relative: 2.9 % (ref 0.0–5.0)
HCT: 41.7 % (ref 36.0–46.0)
HEMOGLOBIN: 13.9 g/dL (ref 12.0–15.0)
Lymphocytes Relative: 29 % (ref 12.0–46.0)
Lymphs Abs: 1.9 10*3/uL (ref 0.7–4.0)
MCHC: 33.2 g/dL (ref 30.0–36.0)
MCV: 83.8 fl (ref 78.0–100.0)
MONOS PCT: 12.1 % — AB (ref 3.0–12.0)
Monocytes Absolute: 0.8 10*3/uL (ref 0.1–1.0)
Neutro Abs: 3.6 10*3/uL (ref 1.4–7.7)
Neutrophils Relative %: 55.4 % (ref 43.0–77.0)
PLATELETS: 282 10*3/uL (ref 150.0–400.0)
RBC: 4.98 Mil/uL (ref 3.87–5.11)
RDW: 13.8 % (ref 11.5–15.5)
WBC: 6.5 10*3/uL (ref 4.0–10.5)

## 2014-04-30 LAB — COMPREHENSIVE METABOLIC PANEL
ALT: 13 U/L (ref 0–35)
AST: 14 U/L (ref 0–37)
Albumin: 4 g/dL (ref 3.5–5.2)
Alkaline Phosphatase: 46 U/L (ref 39–117)
BILIRUBIN TOTAL: 0.3 mg/dL (ref 0.2–1.2)
BUN: 15 mg/dL (ref 6–23)
CALCIUM: 9 mg/dL (ref 8.4–10.5)
CO2: 29 mEq/L (ref 19–32)
CREATININE: 0.77 mg/dL (ref 0.40–1.20)
Chloride: 106 mEq/L (ref 96–112)
GFR: 92.49 mL/min (ref >60.00)
GLUCOSE: 89 mg/dL (ref 70–99)
POTASSIUM: 4.2 meq/L (ref 3.5–5.1)
Sodium: 139 mEq/L (ref 135–145)
Total Protein: 6.6 g/dL (ref 6.0–8.3)

## 2014-04-30 LAB — LIPASE: Lipase: 18 U/L (ref 11.0–59.0)

## 2014-04-30 LAB — H. PYLORI ANTIBODY, IGG: H Pylori IgG: NEGATIVE

## 2014-04-30 MED ORDER — SUCRALFATE 1 GM/10ML PO SUSP: 1.0000 g | Freq: Four times a day (QID) | ORAL | Status: DC

## 2014-04-30 MED ORDER — OMEPRAZOLE 40 MG PO CPDR: DELAYED_RELEASE_CAPSULE | ORAL | Status: DC

## 2014-04-30 NOTE — Progress Notes (Signed)
OFFICE VISIT  04/30/2014  CC:  Chief Complaint  Patient presents with  . Bloated  . Abdominal Pain  . Heartburn   HPI:    Patient is a 34 y.o. Caucasian female who presents for various GI complaints. Starting with bloating 2 wks ago, gassy, bowels irregular for her: some days 3 stools normal w/out cramping and other times 3 days between stools, had been working on [employer] quite a lot.   She then took probiotic and vitamin "cleanse".  This didn't help.  No NSAID intake.  Eating make sx's worse. It has then morphed into epigastric pain down to umbillicus, tight cramp and then bad GERD that has become constant in last 48h.  No fevers.  NO diarrhea.  Pepcid last few days has helped some. Stressed out more lately, planning wedding , still busy with work as usual.  She dropped down to 10mg  cital about 6 wks ago in attempt to ween off eventually.  ROS: fatigue, increased sleep last 2-3 d.  Past Medical History  Diagnosis Date  . GAD (generalized anxiety disorder)     zoloft in the past helped but sexual side effects caused her to d/c it.  . Gastritis Mar/Apr 2014  . Microscopic hematuria     Exam, labs, cysto, and imaging all UNREMARKABLE with Dr. Tresa Moore all NEG--pt to get further eval only if gross hematuria occurs.  Marland Kitchen GERD (gastroesophageal reflux disease)     chronic cough--eval by Dr. Shoemaker-ENT-07/22/13.  . 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 Wisdom tooth extraction  approx 7846    No complications    Outpatient Prescriptions Prior to Visit  Medication Sig Dispense Refill  . citalopram (CELEXA) 20 MG tablet TAKE 1 TABLET BY MOUTH DAILY. 30 tablet 1  . clonazePAM (KLONOPIN) 1 MG tablet TAKE 1/2-1 TABLET BY MOUTH EVERY 12 HOURS AS NEEDED FOR ANXIETY 30 tablet 2  . Multiple Vitamin (MULTIVITAMIN) tablet Take 1 tablet by mouth daily.    Lenard Forth Triphasic (ORTHO TRI-CYCLEN, 28, PO) Take 1 tablet  by mouth daily.    Marland Kitchen albuterol (VENTOLIN HFA) 108 (90 BASE) MCG/ACT inhaler Inhale 2 puffs into the lungs every 4 (four) hours as needed for wheezing. (Patient not taking: Reported on 04/30/2014) 1 Inhaler 0  . fluticasone (FLONASE) 50 MCG/ACT nasal spray Place 2 sprays into both nostrils daily. (Patient not taking: Reported on 04/30/2014) 16 g 6  . citalopram (CELEXA) 20 MG tablet TAKE 1 TABLET DAILY 30 tablet 3  . famotidine (PEPCID) 40 MG tablet Take 1 tablet (40 mg total) by mouth daily. 30 tablet 1  . omeprazole (PRILOSEC) 40 MG capsule Take 40 mg by mouth daily. 5 pm before supper per ENT     No facility-administered medications prior to visit.    Allergies  Allergen Reactions  . Sulfa Antibiotics     ROS As per HPI  PE: Blood pressure 117/75, pulse 91, temperature 98.4 F (36.9 C), temperature source Temporal, resp. rate 18, height 5\' 3"  (1.6 m), weight 145 lb (65.772 kg), SpO2 98 %. Gen: Alert, well appearing.  Patient is oriented to person, place, time, and situation. NGE:XBMW: no injection, icteris, swelling, or exudate.  EOMI, PERRLA. Mouth: lips without lesion/swelling.  Oral mucosa pink and moist. Oropharynx without erythema, exudate, or swelling.  CV: RRR, no m/r/g.   LUNGS: CTA bilat, nonlabored resps, good aeration in all lung fields. ABD: soft,  nondistended, TTP in epigastrum down to umbillicus in midline.  No rebound.  BS slightly hyperactive. No HSM or mass or bruit. EXT: no clubbing, cyanosis, or edema.  Skin - no sores or suspicious lesions or rashes or color changes  LABS:  None today  IMPRESSION AND PLAN:  1) Epigastric pain, suspect acute gastritis with GERD and possibly esophagitis. Prilosec 40 mg bid, carafate qid prn. Check CBC w/diff, CMET, lipase, and H pylori Ab. If not better in 7-10d would do abd u/s to r/o gallstones/cholecystitis. If this was normal then would refer to GI for consideration of EGD.  2) GAD; increase citalopram back to 20mg  qd,  encouraged pt to used her benzo a bit more frequently prn.  An After Visit Summary was printed and given to the patient.  FOLLOW UP: Return in about 10 days (around 05/10/2014) for f/u gastritis.

## 2014-04-30 NOTE — Progress Notes (Signed)
Pre visit review using our clinic review tool, if applicable. No additional management support is needed unless otherwise documented below in the visit note. 

## 2014-05-12 ENCOUNTER — Ambulatory Visit: Payer: 59 | Admitting: Family Medicine

## 2014-08-16 ENCOUNTER — Other Ambulatory Visit: Payer: Self-pay | Admitting: Family Medicine

## 2014-12-02 ENCOUNTER — Other Ambulatory Visit: Payer: Self-pay | Admitting: *Deleted

## 2014-12-02 MED ORDER — CITALOPRAM HYDROBROMIDE 20 MG PO TABS: 20.0000 mg | ORAL_TABLET | Freq: Every day | ORAL | Status: DC

## 2014-12-02 NOTE — Telephone Encounter (Signed)
RF request for citalopram LOV: 04/30/14 Next ov: None Last written: 10/10/14 #30 w/ 1RF  Does pt need to come in for f/u. Please advise. Thanks.

## 2014-12-02 NOTE — Telephone Encounter (Signed)
Left detailed message on cell vm, okay per DPR.  

## 2014-12-02 NOTE — Telephone Encounter (Signed)
Pt is requesting 90 day supply

## 2014-12-02 NOTE — Telephone Encounter (Signed)
I'll RF med.  Have her arrange CPE in 3-6 months.-thx

## 2015-03-29 DIAGNOSIS — H5213 Myopia, bilateral: Secondary | ICD-10-CM | POA: Diagnosis not present

## 2015-03-29 MED FILL — TRI-PREVIFEM TABLET: 0.18/0.215/ | 84 days supply | Qty: 84 | Fill #0

## 2015-04-05 ENCOUNTER — Encounter: Payer: Self-pay | Admitting: Family Medicine

## 2015-04-05 ENCOUNTER — Ambulatory Visit (INDEPENDENT_AMBULATORY_CARE_PROVIDER_SITE_OTHER): Payer: 59 | Admitting: Family Medicine

## 2015-04-05 VITALS — BP 106/75 | HR 80 | Temp 98.2°F | Resp 16 | Ht 63.0 in | Wt 146.5 lb

## 2015-04-05 DIAGNOSIS — J018 Other acute sinusitis: Secondary | ICD-10-CM

## 2015-04-05 DIAGNOSIS — J209 Acute bronchitis, unspecified: Secondary | ICD-10-CM

## 2015-04-05 MED ORDER — AZITHROMYCIN 250 MG PO TABS: ORAL_TABLET | ORAL | Status: DC

## 2015-04-05 MED ORDER — ALBUTEROL SULFATE HFA 108 (90 BASE) MCG/ACT IN AERS: 1.0000 | INHALATION_SPRAY | RESPIRATORY_TRACT | Status: DC | PRN

## 2015-04-05 NOTE — Patient Instructions (Signed)
Get otc generic robitussin DM OR Mucinex DM and use as directed on the packaging for cough and congestion. Use otc generic saline nasal spray 2-3 times per day to irrigate/moisturize your nasal passages.   

## 2015-04-05 NOTE — Progress Notes (Signed)
Pre visit review using our clinic review tool, if applicable. No additional management support is needed unless otherwise documented below in the visit note. 

## 2015-04-05 NOTE — Progress Notes (Signed)
OFFICE VISIT  04/05/2015   CC:  Chief Complaint  Patient presents with  . URI    x 2 weeks   HPI:    Patient is a 35 y.o. Caucasian female who presents for respiratory complaints. Onset about 2 wks ago, nasal cong/sinus pressure, cough that feels heavy and "dry and deep".  Lots of sneezing.  No fevers.  Some HA, some ST in mornings from PND.  Easily SOB with activity/? Wheezing. She no longer has an inhaler.  Symptoms stable if not worsening the last 5d or so.  No otc meds tried.  No n/v/d.   She has chosen to ween off citalopram b/c she and husband are trying to get pregnant--LMP was 2wks ago. She will be starting next month to try to get pregnant. She felt no adverse effects going from 20mg  to 10mg  qd citaloprm and has been on this dosing for 2 wks or so.   Past Medical History  Diagnosis Date  . GAD (generalized anxiety disorder)     zoloft in the past helped but sexual side effects caused her to d/c it.  . Gastritis Mar/Apr 2014  . Microscopic hematuria     Exam, labs, cysto, and imaging all UNREMARKABLE with Dr. Tresa Moore all NEG--pt to get further eval only if gross hematuria occurs.  Marland Kitchen GERD (gastroesophageal reflux disease)     chronic cough--eval by Dr. Shoemaker-ENT-07/22/13.  . 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 Wisdom tooth extraction  approx [id number]    No complications    Outpatient Prescriptions Prior to Visit  Medication Sig Dispense Refill  . citalopram (CELEXA) 20 MG tablet Take 1 tablet (20 mg total) by mouth daily. 90 tablet 1  . clonazePAM (KLONOPIN) 1 MG tablet TAKE 1/2-1 TABLET BY MOUTH EVERY 12 HOURS AS NEEDED FOR ANXIETY 30 tablet 2  . Multiple Vitamin (MULTIVITAMIN) tablet Take 1 tablet by mouth daily.    Lenard Forth Triphasic (ORTHO TRI-CYCLEN, 28, PO) Take 1 tablet by mouth daily.    Marland Kitchen omeprazole (PRILOSEC) 40 MG capsule 1 tab po bid 60  capsule 0  . albuterol (VENTOLIN HFA) 108 (90 BASE) MCG/ACT inhaler Inhale 2 puffs into the lungs every 4 (four) hours as needed for wheezing. (Patient not taking: Reported on 04/05/2015) 1 Inhaler 0  . citalopram (CELEXA) 20 MG tablet TAKE 1 TABLET BY MOUTH DAILY (Patient not taking: Reported on 04/05/2015) 30 tablet PRN  . fluticasone (FLONASE) 50 MCG/ACT nasal spray Place 2 sprays into both nostrils daily. (Patient not taking: Reported on 04/30/2014) 16 g 6  . sucralfate (CARAFATE) 1 GM/10ML suspension Take 10 mLs (1 g total) by mouth 4 (four) times daily. (Patient not taking: Reported on 04/05/2015) 420 mL 0   No facility-administered medications prior to visit.    Allergies  Allergen Reactions  . Sulfa Antibiotics     ROS As per HPI  PE: Blood pressure 106/75, pulse 80, temperature 98.2 F (36.8 C), temperature source Oral, resp. rate 16, height 5\' 3"  (1.6 m), weight 146 lb 8 oz (66.452 kg), last menstrual period 03/23/2015, SpO2 94 %. VS: noted--normal. Gen: alert, NAD, NONTOXIC APPEARING. HEENT: eyes without injection, drainage, or swelling.  Ears: EACs clear, TMs with normal light reflex and landmarks.  Nose: Clear rhinorrhea, with some dried, crusty exudate adherent to mildly injected mucosa.  No purulent d/c.  No paranasal sinus TTP.  No facial swelling.  Throat and mouth without focal lesion.  No pharyngial swelling, erythema, or exudate.   Neck: supple, no LAD.   LUNGS: CTA bilat, nonlabored resps.   CV: RRR, no m/r/g. EXT: no c/c/e SKIN: no rash  LABS:  none  IMPRESSION AND PLAN:  1) Prolonged URI/acute sinusitis, with sx's of acute bronchitis as well. No signif sign of asthma flare at this time. Will refill her ventolin to use q4h prn and see if this helps her chest sx's. Will rx z-pack as well.  No systemic steroids indicated at this time. Further instructions: Get otc generic robitussin DM OR Mucinex DM and use as directed on the packaging for cough and  congestion. Use otc generic saline nasal spray 2-3 times per day to irrigate/moisturize your nasal passages.  2) Hx of GAD, weening herself off citalopram due to pt wanting to get pregnant. She now start citalopram 10mg  qod dosing for 5 doses and then stop this med.  An After Visit Summary was printed and given to the patient.  FOLLOW UP: Return if symptoms worsen or fail to improve.

## 2015-05-13 ENCOUNTER — Other Ambulatory Visit (INDEPENDENT_AMBULATORY_CARE_PROVIDER_SITE_OTHER): Payer: 59

## 2015-05-13 DIAGNOSIS — Z0000 Encounter for general adult medical examination without abnormal findings: Secondary | ICD-10-CM | POA: Diagnosis not present

## 2015-05-13 LAB — COMPREHENSIVE METABOLIC PANEL
ALT: 9 U/L (ref 0–35)
AST: 13 U/L (ref 0–37)
Albumin: 4 g/dL (ref 3.5–5.2)
Alkaline Phosphatase: 34 U/L — ABNORMAL LOW (ref 39–117)
BILIRUBIN TOTAL: 0.5 mg/dL (ref 0.2–1.2)
BUN: 16 mg/dL (ref 6–23)
CALCIUM: 8.8 mg/dL (ref 8.4–10.5)
CHLORIDE: 107 meq/L (ref 96–112)
CO2: 29 meq/L (ref 19–32)
Creatinine, Ser: 0.7 mg/dL (ref 0.40–1.20)
GFR: 102.58 mL/min (ref >60.00)
GLUCOSE: 94 mg/dL (ref 70–99)
POTASSIUM: 4.2 meq/L (ref 3.5–5.1)
Sodium: 140 mEq/L (ref 135–145)
Total Protein: 6.6 g/dL (ref 6.0–8.3)

## 2015-05-13 LAB — CBC WITH DIFFERENTIAL/PLATELET
BASOS ABS: 0.1 10*3/uL (ref 0.0–0.1)
BASOS PCT: 0.7 % (ref 0.0–3.0)
Eosinophils Absolute: 0.3 10*3/uL (ref 0.0–0.7)
Eosinophils Relative: 4.2 % (ref 0.0–5.0)
HEMATOCRIT: 40.5 % (ref 36.0–46.0)
HEMOGLOBIN: 13.2 g/dL (ref 12.0–15.0)
LYMPHS PCT: 43.1 % (ref 12.0–46.0)
Lymphs Abs: 3.1 10*3/uL (ref 0.7–4.0)
MCHC: 32.6 g/dL (ref 30.0–36.0)
MCV: 85 fl (ref 78.0–100.0)
MONOS PCT: 8.1 % (ref 3.0–12.0)
Monocytes Absolute: 0.6 10*3/uL (ref 0.1–1.0)
NEUTROS ABS: 3.1 10*3/uL (ref 1.4–7.7)
Neutrophils Relative %: 43.9 % (ref 43.0–77.0)
PLATELETS: 324 10*3/uL (ref 150.0–400.0)
RBC: 4.77 Mil/uL (ref 3.87–5.11)
RDW: 13.4 % (ref 11.5–15.5)
WBC: 7.2 10*3/uL (ref 4.0–10.5)

## 2015-05-13 LAB — LIPID PANEL
CHOL/HDL RATIO: 3
Cholesterol: 179 mg/dL (ref 0–200)
HDL: 67.5 mg/dL (ref >39.00)
LDL CALC: 89 mg/dL (ref 0–99)
NONHDL: 111.38
Triglycerides: 113 mg/dL (ref 0.0–149.0)
VLDL: 22.6 mg/dL (ref 0.0–40.0)

## 2015-05-13 LAB — TSH: TSH: 2.91 u[IU]/mL (ref 0.35–4.50)

## 2015-06-01 ENCOUNTER — Ambulatory Visit: Payer: 59 | Admitting: Family Medicine

## 2015-06-02 ENCOUNTER — Ambulatory Visit: Payer: 59 | Admitting: Family Medicine

## 2015-06-03 ENCOUNTER — Telehealth: Payer: Self-pay

## 2015-06-03 NOTE — Telephone Encounter (Signed)
-----   Message from Tammi Sou, MD sent at 06/02/2015  3:45 PM EST ----- Please notify: all labs came back normal.  I would have sent result note sooner but I thought she was coming in for a physical and i was waiting to talk to her about results at that time.  -thx

## 2015-06-03 NOTE — Telephone Encounter (Signed)
Left detailed message per DPR  

## 2015-06-10 ENCOUNTER — Ambulatory Visit (INDEPENDENT_AMBULATORY_CARE_PROVIDER_SITE_OTHER): Payer: 59 | Admitting: Family Medicine

## 2015-06-10 ENCOUNTER — Encounter: Payer: Self-pay | Admitting: Family Medicine

## 2015-06-10 VITALS — BP 115/68 | HR 83 | Temp 98.1°F | Resp 16 | Ht 62.0 in | Wt 148.2 lb

## 2015-06-10 DIAGNOSIS — R140 Abdominal distension (gaseous): Secondary | ICD-10-CM

## 2015-06-10 DIAGNOSIS — Z0000 Encounter for general adult medical examination without abnormal findings: Secondary | ICD-10-CM | POA: Diagnosis not present

## 2015-06-10 DIAGNOSIS — K219 Gastro-esophageal reflux disease without esophagitis: Secondary | ICD-10-CM

## 2015-06-10 MED ORDER — OMEPRAZOLE 40 MG PO CPDR: DELAYED_RELEASE_CAPSULE | ORAL | Status: DC

## 2015-06-10 NOTE — Progress Notes (Signed)
Office Note 06/10/2015  CC:  Chief Complaint  Patient presents with  . Annual Exam    Labs done 05/13/15    HPI:  Heather Conley is a 35 y.o. White female who is here for annual health maintenance exam. Her only complaint is some gassiness and mild crampy/upset stomach, feels bloated.  Happens in evenings and only sporadically---seems like the last few months or so.  Is gone by the morning.  No change in bowel habits associated with this.  No nausea, no RUQ pain. The area is in the general mid-abdomen region.  She has had lifelong mild issues with her GI system. She no longer takes daily PPI.  We reviewed her fasting health panel labs done 1 mo ago in detail today: all normal. She gets pap/pelvic through her GYN, Dr. Julien Girt, and this is UTD (07/2014).  She has weened herself off of her citalopram and clonazepam b/c she and her husband are going to try to have a baby beginning next month.  Past Medical History  Diagnosis Date  . GAD (generalized anxiety disorder)     zoloft in the past helped but sexual side effects caused her to d/c it.  . Gastritis Mar/Apr 2014  . Microscopic hematuria     Exam, labs, cysto, and imaging all UNREMARKABLE with Dr. Tresa Moore all NEG--pt to get further eval only if gross hematuria occurs.  Marland Kitchen GERD (gastroesophageal reflux disease)     chronic cough--eval by Dr. Shoemaker-ENT-07/22/13.  . 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 Wisdom tooth extraction  approx [id number]    No complications    Family History  Problem Relation Age of Onset  . Alcohol abuse Mother   . Heart disease Maternal Uncle   . Heart disease Maternal Grandfather   . Stroke Paternal Grandmother     Social History   Social History  . Marital Status: Single    Spouse Name: N/A  . Number of Children: N/A  . Years of Education: N/A   Occupational History  . Not on file.   Social History Main Topics  . Smoking  status: Never Smoker   . Smokeless tobacco: Never Used  . Alcohol Use: Yes     Comment: social  . Drug Use: No  . Sexual Activity: Not on file   Other Topics Concern  . Not on file   Social History Narrative   Single, no children.  Lives with fiance in [place].   Orig from [place].     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   No tobacco, occas alcohol, no drugs.   Exercises 3-5 days per week.   Normal diet.         MEDS: OCP qd, albuterol HFA q4h prn  Allergies  Allergen Reactions  . Sulfa Antibiotics     ROS Review of Systems  Constitutional: Negative for fever, chills, appetite change and fatigue.  HENT: Negative for congestion, dental problem, ear pain and sore throat.   Eyes: Negative for discharge, redness and visual disturbance.  Respiratory: Negative for cough, chest tightness, shortness of breath and wheezing.   Cardiovascular: Negative for chest pain, palpitations and leg swelling.  Gastrointestinal: Negative for nausea, vomiting, diarrhea and blood in stool.  Genitourinary: Negative for dysuria, urgency, frequency, hematuria, flank pain and difficulty urinating.  Musculoskeletal: Negative for myalgias, back pain, joint swelling, arthralgias and neck stiffness.  Skin: Negative for pallor and rash.  Neurological: Negative for dizziness, speech difficulty, weakness and headaches.  Hematological: Negative for adenopathy. Does not bruise/bleed easily.  Psychiatric/Behavioral: Negative for confusion and sleep disturbance. The patient is not nervous/anxious.     PE; Blood pressure 115/68, pulse 83, temperature 98.1 F (36.7 C), temperature source Oral, resp. rate 16, height 5\' 2"  (1.575 m), weight 148 lb 4 oz (67.246 kg), last menstrual period 05/18/2015, SpO2 97 %. Gen: Alert, well appearing.  Patient is oriented to person, place, time, and situation. AFFECT: pleasant, lucid thought and speech. ENT: Ears: EACs clear, normal epithelium.   TMs with good light reflex and landmarks bilaterally.  Eyes: no injection, icteris, swelling, or exudate.  EOMI, PERRLA. Nose: no drainage or turbinate edema/swelling.  No injection or focal lesion.  Mouth: lips without lesion/swelling.  Oral mucosa pink and moist.  Dentition intact and without obvious caries or gingival swelling.  Oropharynx without erythema, exudate, or swelling.  Neck: supple/nontender.  No LAD, mass, or TM.  Carotid pulses 2+ bilaterally, without bruits. CV: RRR, no m/r/g.   LUNGS: CTA bilat, nonlabored resps, good aeration in all lung fields. ABD: soft, NT except mild soreness to palpation in RUQ region, ND, BS normal.  No hepatospenomegaly or mass.  No bruits. EXT: no clubbing, cyanosis, or edema.  Musculoskeletal: no joint swelling, erythema, warmth, or tenderness.  ROM of all joints intact. Skin - no sores or suspicious lesions or rashes or color changes  Pertinent labs:  Lab Results  Component Value Date   TSH 2.91 05/13/2015   Lab Results  Component Value Date   WBC 7.2 05/13/2015   HGB 13.2 05/13/2015   HCT 40.5 05/13/2015   MCV 85.0 05/13/2015   PLT 324.0 05/13/2015   Lab Results  Component Value Date   CREATININE 0.70 05/13/2015   BUN 16 05/13/2015   NA 140 05/13/2015   K 4.2 05/13/2015   CL 107 05/13/2015   CO2 29 05/13/2015   Lab Results  Component Value Date   ALT 9 05/13/2015   AST 13 05/13/2015   ALKPHOS 34* 05/13/2015   BILITOT 0.5 05/13/2015   Lab Results  Component Value Date   CHOL 179 05/13/2015   Lab Results  Component Value Date   HDL 67.50 05/13/2015   Lab Results  Component Value Date   LDLCALC 89 05/13/2015   Lab Results  Component Value Date   TRIG 113.0 05/13/2015   Lab Results  Component Value Date   CHOLHDL 3 05/13/2015   ASSESSMENT AND PLAN:   1) Intermittent abd pain with bloating/gas: suspect this is food-related (may be IBS, constipation predominant). Gave handout on "food and gas".  Also recommended  she try to get back on daily PPI--sent in omeprazole 40mg  qd today.  If she is worsening or if not improving in 1 mo then she'll either return or call and let me know and I'll do celiac blood screening.  2) Health maintenance exam: Reviewed age and gender appropriate health maintenance issues (prudent diet, regular exercise, health risks of tobacco and excessive alcohol, use of seatbelts, fire alarms in home, use of sunscreen).  Also reviewed age and gender appropriate health screening as well as vaccine recommendations. HP labs all excellent. Continue great exercise regimen! Vaccines all UTD.  An After Visit Summary was printed and given to the patient.   FOLLOW UP:  Return in about 1 year (around 06/09/2016) for annual CPE (fasting).  Signed:  Crissie Sickles, MD  06/10/2015   

## 2015-06-10 NOTE — Progress Notes (Signed)
Pre visit review using our clinic review tool, if applicable. No additional management support is needed unless otherwise documented below in the visit note. 

## 2015-06-17 ENCOUNTER — Encounter: Payer: 59 | Admitting: Family Medicine

## 2015-07-18 ENCOUNTER — Other Ambulatory Visit: Payer: Self-pay | Admitting: Family Medicine

## 2015-07-18 ENCOUNTER — Telehealth: Payer: Self-pay | Admitting: Family Medicine

## 2015-07-18 MED ORDER — SCOPOLAMINE 1 MG/3DAYS TD PT72: MEDICATED_PATCH | TRANSDERMAL | Status: DC

## 2015-07-18 NOTE — Telephone Encounter (Signed)
Pt called back and stated that she is okay with the patch but will need enough for two days. Please advise. Thanks.

## 2015-07-18 NOTE — Telephone Encounter (Signed)
Please advise. Thanks.  

## 2015-07-18 NOTE — Telephone Encounter (Signed)
Caller name:Nayra Relationship to patient: Can be reached:[phone] Pharmacy: outpatient  At [place] Reason for call:she is going deep sea fishing has used the scopolamine patches before but has read where the scopolamine pills work better.  Would you call her in an rx for this for a 2 day fishing trip

## 2015-07-18 NOTE — Telephone Encounter (Signed)
I have not heard of a scopolamine pill, so I looked it up and found that there is a scopolamine pill but it appears to be only available in [place].  All we have in the [place] is the patch and injection solution.  Is she ok with the patch now?  Let me know.

## 2015-07-18 NOTE — Telephone Encounter (Signed)
OK, tell her each patch lasts 72h. I'll send rx in now.

## 2015-07-18 NOTE — Telephone Encounter (Signed)
Left message for pt to call back  °

## 2015-07-19 NOTE — Telephone Encounter (Signed)
Left detailed message on cell vm, okay per DPR.  

## 2015-07-21 MED FILL — TRANSDERM-SCOP 1.5 MG/72HR: 1 | 12 days supply | Qty: 4 | Fill #0

## 2015-09-07 ENCOUNTER — Ambulatory Visit: Payer: 59 | Admitting: Family Medicine

## 2015-09-09 ENCOUNTER — Ambulatory Visit: Payer: 59 | Admitting: Family Medicine

## 2015-09-09 DIAGNOSIS — Z0289 Encounter for other administrative examinations: Secondary | ICD-10-CM

## 2015-09-29 ENCOUNTER — Encounter: Payer: Self-pay | Admitting: Family Medicine

## 2015-09-29 ENCOUNTER — Ambulatory Visit (INDEPENDENT_AMBULATORY_CARE_PROVIDER_SITE_OTHER): Payer: 59 | Admitting: Family Medicine

## 2015-09-29 VITALS — BP 102/70 | HR 71 | Temp 98.5°F | Resp 20 | Wt 150.0 lb

## 2015-09-29 DIAGNOSIS — J302 Other seasonal allergic rhinitis: Secondary | ICD-10-CM

## 2015-09-29 DIAGNOSIS — J4531 Mild persistent asthma with (acute) exacerbation: Secondary | ICD-10-CM

## 2015-09-29 MED ORDER — ALBUTEROL SULFATE (2.5 MG/3ML) 0.083% IN NEBU
2.5000 mg | INHALATION_SOLUTION | Freq: Once | RESPIRATORY_TRACT | Status: AC
  Administered 2015-09-29: 2.5 mg via RESPIRATORY_TRACT

## 2015-09-29 MED ORDER — ALBUTEROL SULFATE HFA 108 (90 BASE) MCG/ACT IN AERS: 1.0000 | INHALATION_SPRAY | RESPIRATORY_TRACT | Status: DC | PRN

## 2015-09-29 NOTE — Progress Notes (Signed)
Patient ID: Heather Conley, female   DOB: 02/25/1981, 35 y.o.   MRN: [id number]    Heather Conley , 12-09-1980, 35 y.o., female MRN: [id number] Patient Care Team    Relationship Specialty Notifications Start End  Tammi Sou, MD PCP - General Family Medicine  04/04/12    Comment: Merged (Merged)  Druscilla Brownie, MD Consulting Physician Dermatology  05/22/12   Alexis Frock, MD Consulting Physician Urology  11/25/12   Jerrell Belfast, MD Consulting Physician Otolaryngology  07/22/13   Marylynn Pearson, MD Consulting Physician Obstetrics and Gynecology  06/10/15     CC: Sore throat Subjective: Pt presents for an acute OV with complaints of sore throat of 3 days duration.  Associated symptoms include chest heaviness, fatigue, mils shortness of breath. She has mild asthma, but currently does not have any albuterol. She is has not tried anything because she is trying to get pregnant. She states her shortness of breath is tight, last about 5 minutes at his full length.  She denies nausea, vomit, diarrhea, fever or chills.  She endorses facial pressure and headaches.  tdap UTD. She is taking a prenatal vitamin.  Patient's last menstrual period was 09/09/2015.   Allergies  Allergen Reactions  . Sulfa Antibiotics    Social History  Substance Use Topics  . Smoking status: Never Smoker   . Smokeless tobacco: Never Used  . Alcohol Use: Yes     Comment: social   Past Medical History  Diagnosis Date  . GAD (generalized anxiety disorder)     zoloft in the past helped but sexual side effects caused her to d/c it.  . Gastritis Mar/Apr 2014  . Microscopic hematuria     Exam, labs, cysto, and imaging all UNREMARKABLE with Dr. Tresa Moore all NEG--pt to get further eval only if gross hematuria occurs.  Marland Kitchen GERD (gastroesophageal reflux disease)     chronic cough--eval by Dr. Shoemaker-ENT-07/22/13.  . 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  Wisdom tooth extraction  approx [id number]    No complications   Family History  Problem Relation Age of Onset  . Alcohol abuse Mother   . Heart disease Maternal Uncle   . Heart disease Maternal Grandfather   . Stroke Paternal Grandmother      Medication List       This list is accurate as of: 09/29/15  8:47 AM.  Always use your most recent med list.               albuterol 108 (90 Base) MCG/ACT inhaler  Commonly known as:  VENTOLIN HFA  Inhale 1-2 puffs into the lungs every 4 (four) hours as needed for wheezing or shortness of breath.     multivitamin tablet  Take 1 tablet by mouth daily.        No results found for this or any previous visit (from the past 24 hour(s)). No results found.   ROS: Negative, with the exception of above mentioned in HPI  Objective:  BP 102/70 mmHg  Pulse 71  Temp(Src) 98.5 F (36.9 C)  Resp 20  Wt 150 lb (68.04 kg)  SpO2 95%  LMP 09/09/2015 Body mass index is 27.43 kg/(m^2). Gen: Afebrile. No acute distress. Nontoxic in appearance, well developed, well nourished.  HENT: AT. [place]. Bilateral TM visualized no erythema or bulging. MMM, no oral lesions. Bilateral nares no erythema or sweling. Throat without erythema or exudates. PND  present, No facial sinus pressure.to palpation.  Eyes:Pupils Equal Round Reactive to light, Extraocular movements intact,  Conjunctiva without redness, discharge or icterus. Neck/lymp/endocrine: Supple,no lymphadenopathy CV: RRR Chest: CTAB, no wheeze or crackles. Good air movement, normal resp effort.  Abd: Soft. NTND. BS present. Skin: no rashes, purpura or petechiae.  Neuro: Normal gait. PERLA. EOMi. Alert. Oriented x3  Psych: Normal affect, dress and demeanor. Normal speech. Normal thought content and judgment.  Assessment/Plan: Heather Conley is a 35 y.o. female present for acute OV for  Mild persistent asthma, with mild acute exacerbation/seasonal allergies: - Very mild chest tightness without current  wheezing or signs of infection. Albuterol treatment x1 in office to relieve tightness. Allergy control to help with symptoms.  - Start OTC Claritin QD - If not pregnant, consider starting flonase for a few weeks. - encouraged her to talk to her gyn about anxiety medications during pregnancy.  - Albuterol refilled. Discussed proper use, if needing more than 2x a week will need to be seen for asthma.  - F/U PRN  > 25 minutes spent with patient, >50% of time spent face to face counseling patient and coordinating care.   electronically signed by:  Howard Pouch, DO  [place]

## 2015-09-29 NOTE — Patient Instructions (Signed)
-   Start OTC Claritin QD - If not pregnant, consider starting flonase for a few weeks. - talk to your gyn about anxiety medications during pregnancy.  - Albuterol refilled.  if needing more than 2x a week will need to be seen for asthma.

## 2015-09-29 NOTE — Addendum Note (Signed)
Addended by: Leota Jacobsen on: 09/29/2015 09:25 AM   Modules accepted: Orders, SmartSet

## 2015-10-26 DIAGNOSIS — Z01419 Encounter for gynecological examination (general) (routine) without abnormal findings: Secondary | ICD-10-CM | POA: Diagnosis not present

## 2015-10-26 DIAGNOSIS — Z6826 Body mass index (BMI) 26.0-26.9, adult: Secondary | ICD-10-CM | POA: Diagnosis not present

## 2015-12-05 DIAGNOSIS — N911 Secondary amenorrhea: Secondary | ICD-10-CM | POA: Diagnosis not present

## 2015-12-13 DIAGNOSIS — Z3401 Encounter for supervision of normal first pregnancy, first trimester: Secondary | ICD-10-CM | POA: Diagnosis not present

## 2015-12-13 DIAGNOSIS — Z3A09 9 weeks gestation of pregnancy: Secondary | ICD-10-CM | POA: Diagnosis not present

## 2015-12-13 DIAGNOSIS — Z36 Encounter for antenatal screening of mother: Secondary | ICD-10-CM | POA: Diagnosis not present

## 2015-12-18 DIAGNOSIS — J209 Acute bronchitis, unspecified: Secondary | ICD-10-CM | POA: Diagnosis not present

## 2015-12-22 DIAGNOSIS — Z3A10 10 weeks gestation of pregnancy: Secondary | ICD-10-CM | POA: Diagnosis not present

## 2015-12-22 DIAGNOSIS — Z31430 Encounter of female for testing for genetic disease carrier status for procreative management: Secondary | ICD-10-CM | POA: Diagnosis not present

## 2015-12-22 DIAGNOSIS — Z36 Encounter for antenatal screening of mother: Secondary | ICD-10-CM | POA: Diagnosis not present

## 2015-12-22 DIAGNOSIS — Z13228 Encounter for screening for other metabolic disorders: Secondary | ICD-10-CM | POA: Diagnosis not present

## 2015-12-22 DIAGNOSIS — Z3481 Encounter for supervision of other normal pregnancy, first trimester: Secondary | ICD-10-CM | POA: Diagnosis not present

## 2015-12-22 DIAGNOSIS — Z130 Encounter for screening for diseases of the blood and blood-forming organs and certain disorders involving the immune mechanism: Secondary | ICD-10-CM | POA: Diagnosis not present

## 2015-12-22 DIAGNOSIS — Z113 Encounter for screening for infections with a predominantly sexual mode of transmission: Secondary | ICD-10-CM | POA: Diagnosis not present

## 2015-12-22 DIAGNOSIS — Z3401 Encounter for supervision of normal first pregnancy, first trimester: Secondary | ICD-10-CM | POA: Diagnosis not present

## 2016-01-05 DIAGNOSIS — Z3491 Encounter for supervision of normal pregnancy, unspecified, first trimester: Secondary | ICD-10-CM | POA: Diagnosis not present

## 2016-01-05 DIAGNOSIS — Z36 Encounter for antenatal screening for chromosomal anomalies: Secondary | ICD-10-CM | POA: Diagnosis not present

## 2016-01-05 DIAGNOSIS — Z3682 Encounter for antenatal screening for nuchal translucency: Secondary | ICD-10-CM | POA: Diagnosis not present

## 2016-01-05 DIAGNOSIS — Z3A12 12 weeks gestation of pregnancy: Secondary | ICD-10-CM | POA: Diagnosis not present

## 2016-01-06 ENCOUNTER — Encounter: Payer: Self-pay | Admitting: Family Medicine

## 2016-01-06 ENCOUNTER — Ambulatory Visit (INDEPENDENT_AMBULATORY_CARE_PROVIDER_SITE_OTHER): Payer: 59 | Admitting: Family Medicine

## 2016-01-06 VITALS — BP 112/61 | HR 86 | Temp 98.0°F | Resp 16 | Wt 156.4 lb

## 2016-01-06 DIAGNOSIS — Z111 Encounter for screening for respiratory tuberculosis: Secondary | ICD-10-CM | POA: Diagnosis not present

## 2016-01-06 DIAGNOSIS — J209 Acute bronchitis, unspecified: Secondary | ICD-10-CM | POA: Diagnosis not present

## 2016-01-06 NOTE — Progress Notes (Signed)
OFFICE VISIT  01/06/2016   CC:  Chief Complaint  Patient presents with  . Cough    cough x 2 weeks, urgent care 2 weeks ago, patient [redacted] weeks pregnant   HPI:    Patient is a 35 y.o. Caucasian female who presents for cough. Went to UC about 2+ wks ago b/c she was wheezing.  She was rx'd albuterol and it has been helpful. Says cough has improved but still bothers her mostly at night, occ productive of yellow sputum.  Slight nasal congestion.  No ST. No fever.  Eating and drinking well. She is currently [redacted] weeks pregnant.  Past Medical History:  Diagnosis Date  . GAD (generalized anxiety disorder)    zoloft in the past helped but sexual side effects caused her to d/c it.  . Gastritis Mar/Apr 2014  . GERD (gastroesophageal reflux disease)    chronic cough--eval by Dr. Shoemaker-ENT-07/22/13.  . Microscopic hematuria    Exam, labs, cysto, and imaging all UNREMARKABLE with Dr. Tresa Moore all NEG--pt to get further eval only if gross hematuria occurs.  . 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 WISDOM TOOTH EXTRACTION  approx [id number]   No complications    Outpatient Medications Prior to Visit  Medication Sig Dispense Refill  . albuterol (VENTOLIN HFA) 108 (90 Base) MCG/ACT inhaler Inhale 1-2 puffs into the lungs every 4 (four) hours as needed for wheezing or shortness of breath. 1 Inhaler 5  . Multiple Vitamin (MULTIVITAMIN) tablet Take 1 tablet by mouth daily.     No facility-administered medications prior to visit.     Allergies  Allergen Reactions  . Sulfa Antibiotics     ROS As per HPI  PE: Blood pressure 112/61, pulse 86, temperature 98 F (36.7 C), resp. rate 16, weight 156 lb 6.4 oz (70.9 kg), last menstrual period 10/13/2015, SpO2 97 %. VS: noted--normal. Gen: alert, NAD, NONTOXIC APPEARING. HEENT: eyes without injection, drainage, or swelling.  Ears: EACs clear, TMs with normal light reflex and landmarks.   Nose: Clear rhinorrhea, with some dried, crusty exudate adherent to mildly injected mucosa.  No purulent d/c.  No paranasal sinus TTP.  No facial swelling.  Throat and mouth without focal lesion.  No pharyngial swelling, erythema, or exudate.   Neck: supple, no LAD.   LUNGS: CTA bilat, nonlabored resps.   CV: RRR, no m/r/g. EXT: no c/c/e SKIN: no rash  LABS:  none  IMPRESSION AND PLAN:  Acute bronchitis, gradually resolving. Continue albuterol HFA q4h prn. Signs/symptoms to call or return for were reviewed and pt expressed understanding.  She has to get TB screening blood test done for her employer and she asks if this can be drawn here so I did this today.  An After Visit Summary was printed and given to the patient.  FOLLOW UP: Return if symptoms worsen or fail to improve.  Signed:  Crissie Sickles, MD           01/06/2016

## 2016-01-06 NOTE — Progress Notes (Signed)
Pre visit review using our clinic review tool, if applicable. No additional management support is needed unless otherwise documented below in the visit note. 

## 2016-01-09 LAB — QUANTIFERON TB GOLD ASSAY (BLOOD)
Interferon Gamma Release Assay: NEGATIVE
Mitogen-Nil: >10 IU/mL
QUANTIFERON TB AG MINUS NIL: 0 [IU]/mL
Quantiferon Nil Value: 0.03 IU/mL

## 2016-01-20 DIAGNOSIS — Z23 Encounter for immunization: Secondary | ICD-10-CM | POA: Diagnosis not present

## 2016-01-20 DIAGNOSIS — Z3400 Encounter for supervision of normal first pregnancy, unspecified trimester: Secondary | ICD-10-CM | POA: Diagnosis not present

## 2016-02-22 DIAGNOSIS — Z363 Encounter for antenatal screening for malformations: Secondary | ICD-10-CM | POA: Diagnosis not present

## 2016-02-22 DIAGNOSIS — Z3A26 26 weeks gestation of pregnancy: Secondary | ICD-10-CM | POA: Diagnosis not present

## 2016-02-22 DIAGNOSIS — Z3A18 18 weeks gestation of pregnancy: Secondary | ICD-10-CM | POA: Diagnosis not present

## 2016-02-22 DIAGNOSIS — Z3400 Encounter for supervision of normal first pregnancy, unspecified trimester: Secondary | ICD-10-CM | POA: Diagnosis not present

## 2016-04-13 ENCOUNTER — Inpatient Hospital Stay (HOSPITAL_COMMUNITY): Payer: 59

## 2016-04-13 ENCOUNTER — Observation Stay (HOSPITAL_COMMUNITY)
Admission: AD | Admit: 2016-04-13 | Discharge: 2016-04-14 | Disposition: A | Discharge status: Home / Self Care | Payer: 59 | Source: Ambulatory Visit | Attending: Obstetrics and Gynecology | Admitting: Obstetrics and Gynecology

## 2016-04-13 ENCOUNTER — Encounter (HOSPITAL_COMMUNITY): Payer: Self-pay | Admitting: *Deleted

## 2016-04-13 DIAGNOSIS — O9989 Other specified diseases and conditions complicating pregnancy, childbirth and the puerperium: Secondary | ICD-10-CM | POA: Diagnosis not present

## 2016-04-13 DIAGNOSIS — H10413 Chronic giant papillary conjunctivitis, bilateral: Secondary | ICD-10-CM | POA: Diagnosis not present

## 2016-04-13 DIAGNOSIS — Y929 Unspecified place or not applicable: Secondary | ICD-10-CM | POA: Insufficient documentation

## 2016-04-13 DIAGNOSIS — Y999 Unspecified external cause status: Secondary | ICD-10-CM | POA: Insufficient documentation

## 2016-04-13 DIAGNOSIS — O9A212 Injury, poisoning and certain other consequences of external causes complicating pregnancy, second trimester: Secondary | ICD-10-CM | POA: Diagnosis not present

## 2016-04-13 DIAGNOSIS — W000XXA Fall on same level due to ice and snow, initial encounter: Secondary | ICD-10-CM | POA: Diagnosis not present

## 2016-04-13 DIAGNOSIS — Z3A26 26 weeks gestation of pregnancy: Secondary | ICD-10-CM | POA: Insufficient documentation

## 2016-04-13 DIAGNOSIS — Y939 Activity, unspecified: Secondary | ICD-10-CM | POA: Diagnosis not present

## 2016-04-13 DIAGNOSIS — H5213 Myopia, bilateral: Secondary | ICD-10-CM | POA: Diagnosis not present

## 2016-04-13 DIAGNOSIS — W009XXA Unspecified fall due to ice and snow, initial encounter: Secondary | ICD-10-CM | POA: Diagnosis not present

## 2016-04-13 DIAGNOSIS — O4700 False labor before 37 completed weeks of gestation, unspecified trimester: Secondary | ICD-10-CM | POA: Diagnosis present

## 2016-04-13 DIAGNOSIS — O4702 False labor before 37 completed weeks of gestation, second trimester: Secondary | ICD-10-CM | POA: Diagnosis not present

## 2016-04-13 DIAGNOSIS — O479 False labor, unspecified: Secondary | ICD-10-CM | POA: Diagnosis present

## 2016-04-13 DIAGNOSIS — W19XXXA Unspecified fall, initial encounter: Secondary | ICD-10-CM

## 2016-04-13 LAB — CBC
HEMATOCRIT: 34.6 % — AB (ref 36.0–46.0)
HEMOGLOBIN: 11.6 g/dL — AB (ref 12.0–15.0)
MCH: 28.7 pg (ref 26.0–34.0)
MCHC: 33.5 g/dL (ref 30.0–36.0)
MCV: 85.6 fL (ref 78.0–100.0)
Platelets: 279 10*3/uL (ref 150–400)
RBC: 4.04 MIL/uL (ref 3.87–5.11)
RDW: 14.1 % (ref 11.5–15.5)
WBC: 14.5 10*3/uL — ABNORMAL HIGH (ref 4.0–10.5)

## 2016-04-13 LAB — URINALYSIS, ROUTINE W REFLEX MICROSCOPIC
Bilirubin Urine: NEGATIVE
GLUCOSE, UA: 50 mg/dL — AB
Ketones, ur: NEGATIVE mg/dL
NITRITE: NEGATIVE
Protein, ur: NEGATIVE mg/dL
SPECIFIC GRAVITY, URINE: 1.024 (ref 1.005–1.030)
pH: 6 (ref 5.0–8.0)

## 2016-04-13 LAB — FETAL FIBRONECTIN: Fetal Fibronectin: NEGATIVE

## 2016-04-13 LAB — TYPE AND SCREEN
ABO/RH(D): O POS
Antibody Screen: NEGATIVE

## 2016-04-13 LAB — ABO/RH: ABO/RH(D): O POS

## 2016-04-13 MED ORDER — NIFEDIPINE 10 MG PO CAPS
10.0000 mg | ORAL_CAPSULE | ORAL | Status: DC | PRN
Start: 2016-04-13 — End: 2016-04-13
  Administered 2016-04-13: 10 mg via ORAL
  Filled 2016-04-13 (×2): qty 1

## 2016-04-13 MED ORDER — ALBUTEROL SULFATE (2.5 MG/3ML) 0.083% IN NEBU: 3.0000 mL | INHALATION_SOLUTION | RESPIRATORY_TRACT | Status: DC | PRN

## 2016-04-13 MED ORDER — LACTATED RINGERS IV SOLN
2.0000 g/h | INTRAVENOUS | Status: DC
  Filled 2016-04-13: qty 80

## 2016-04-13 MED ORDER — DOCUSATE SODIUM 100 MG PO CAPS: 100.0000 mg | ORAL_CAPSULE | Freq: Every day | ORAL | Status: DC

## 2016-04-13 MED ORDER — MAGNESIUM SULFATE BOLUS VIA INFUSION
4.0000 g | Freq: Once | INTRAVENOUS | Status: DC
  Filled 2016-04-13: qty 500

## 2016-04-13 MED ORDER — BETAMETHASONE SOD PHOS & ACET 6 (3-3) MG/ML IJ SUSP
12.0000 mg | INTRAMUSCULAR | Status: DC
  Administered 2016-04-13: 12 mg via INTRAMUSCULAR
  Filled 2016-04-13: qty 2

## 2016-04-13 MED ORDER — PRENATAL MULTIVITAMIN CH: 1.0000 | ORAL_TABLET | Freq: Every day | ORAL | Status: DC

## 2016-04-13 MED ORDER — CALCIUM CARBONATE ANTACID 500 MG PO CHEW: 2.0000 | CHEWABLE_TABLET | ORAL | Status: DC | PRN

## 2016-04-13 MED ORDER — ZOLPIDEM TARTRATE 5 MG PO TABS: 5.0000 mg | ORAL_TABLET | Freq: Every evening | ORAL | Status: DC | PRN

## 2016-04-13 MED ORDER — ACETAMINOPHEN 325 MG PO TABS: 650.0000 mg | ORAL_TABLET | ORAL | Status: DC | PRN

## 2016-04-13 MED ORDER — LACTATED RINGERS IV SOLN
INTRAVENOUS | Status: DC
  Administered 2016-04-13: 19:00:00 via INTRAVENOUS

## 2016-04-13 NOTE — MAU Note (Signed)
Fell in the ice, snow.  Landed on but and left hand. No bleeding or leaking. Little sore initially, none now

## 2016-04-13 NOTE — Progress Notes (Signed)
No complaints, no bleeding/leaking/pressure  VSS Afeb  Uterus soft, NT  FHT cat one UCs irritability  BMZ #1 done  A/P: D/W patient         Observation tonight

## 2016-04-13 NOTE — H&P (Signed)
Heather Conley is a 36 y.o. female presenting for preterm contraction. Patient fell today on ice. Fell onto left hip. Does not think she struck abdomen. No leaking or bleeding. Was sent to MAU for monitoring S/P fall.  OB History    Gravida Para Term Preterm AB Living   1             SAB TAB Ectopic Multiple Live Births                 Past Medical History:  Diagnosis Date  . GAD (generalized anxiety disorder)    zoloft in the past helped but sexual side effects caused her to d/c it.  . Gastritis Mar/Apr 2014  . GERD (gastroesophageal reflux disease)    chronic cough--eval by Dr. Shoemaker-ENT-07/22/13.  . Microscopic hematuria    Exam, labs, cysto, and imaging all UNREMARKABLE with Dr. Tresa Moore all NEG--pt to get further eval only if gross hematuria occurs.  . 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 WISDOM TOOTH EXTRACTION  approx [id number]   No complications   Family History: family history includes Alcohol abuse in her mother; Heart disease in her maternal grandfather and maternal uncle; Stroke in her paternal grandmother. Social History:  reports that she has never smoked. She has never used smokeless tobacco. She reports that she drinks alcohol. She reports that she does not use drugs.     Maternal Diabetes: No Genetic Screening: Normal Maternal Ultrasounds/Referrals: Normal Fetal Ultrasounds or other Referrals:  None Maternal Substance Abuse:  No Significant Maternal Medications:  None Significant Maternal Lab Results:  None Other Comments:  None  ROS History Dilation: Closed Effacement (%): Thick Exam by:: Jorje Guild NP Blood pressure 130/61, pulse 93, temperature 98.5 F (36.9 C), temperature source Oral, resp. rate 18, last menstrual period 10/13/2015, SpO2 100 %. Exam Physical Exam  Prenatal labs: ABO, Rh:   Antibody:   Rubella:   RPR:    HBsAg:    HIV:    GBS:     Cx closed and thick to exam per  NP U/S shows good growth, transverse, normal AFI, anterior placenta appears normal and cervix appears normal FFN is negative  FHT + accels UCs q2-3 min-persist after po fluids, po Procardia which led to tachycardia, and observation  Assessment/Plan: 36 yo G1P0 @ 26 1/7 weeks S/P fall Reassuring fetal evaluation Persistent regular contractions-not tolerant of po Procardia D/W patient Recommend betamethasone We discussed magnesium sulfate tocolysis-patient requests we hold it if possible pending observation Will abserve, IV fluids, monitors If labor appears to progress will begin magnesium tocolysis Patient states she understands and agrees   Cristine Daw II,Sui Kasparek E 04/13/2016, 6:01 PM

## 2016-04-13 NOTE — Progress Notes (Signed)
Urine in lab 

## 2016-04-13 NOTE — MAU Provider Note (Signed)
History     CSN: [id number]  Arrival date and time: 04/13/16 1228   First Provider Initiated Contact with Patient 04/13/16 1313      Chief Complaint  Patient presents with  . Fall   HPI  Heather Conley is a 36 y.o. G1P0 at [redacted]w[redacted]d who presents s/p fall. Fall occurred around 815 this morning as patient was walking into a building. States she slipped on ice and landed on her bottom. States left hip, left hand, & bottom took brunt of fall. Denies hitting her abdomen or head, or LOC. Some lower abdominal pressure that she states has been present for the last few weeks. Denies vaginal bleeding or LOF. Positive fetal movement. Denies back, hip, or wrist pain.   OB History    Gravida Para Term Preterm AB Living   1             SAB TAB Ectopic Multiple Live Births                  Past Medical History:  Diagnosis Date  . GAD (generalized anxiety disorder)    zoloft in the past helped but sexual side effects caused her to d/c it.  . Gastritis Mar/Apr 2014  . GERD (gastroesophageal reflux disease)    chronic cough--eval by Dr. Shoemaker-ENT-07/22/13.  . Microscopic hematuria    Exam, labs, cysto, and imaging all UNREMARKABLE with Dr. Tresa Moore all NEG--pt to get further eval only if gross hematuria occurs.  . 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 WISDOM TOOTH EXTRACTION  approx [id number]   No complications    Family History  Problem Relation Age of Onset  . Alcohol abuse Mother   . Heart disease Maternal Uncle   . Heart disease Maternal Grandfather   . Stroke Paternal Grandmother     Social History  Substance Use Topics  . Smoking status: Never Smoker  . Smokeless tobacco: Never Used  . Alcohol use Yes     Comment: social    Allergies:  Allergies  Allergen Reactions  . Sulfa Antibiotics     Prescriptions Prior to Admission  Medication Sig Dispense Refill Last Dose  . albuterol (VENTOLIN HFA) 108 (90 Base)  MCG/ACT inhaler Inhale 1-2 puffs into the lungs every 4 (four) hours as needed for wheezing or shortness of breath. 1 Inhaler 5   . Multiple Vitamin (MULTIVITAMIN) tablet Take 1 tablet by mouth daily.   Taking    Review of Systems  Constitutional: Negative.   Gastrointestinal: Negative.   Genitourinary: Negative.   Musculoskeletal: Negative for back pain, gait problem and neck pain.  Neurological: Negative for dizziness and syncope.   Physical Exam   Blood pressure 117/57, pulse 96, temperature 98.5 F (36.9 C), temperature source Oral, resp. rate 18, last menstrual period 10/13/2015.  Physical Exam  Nursing note and vitals reviewed. Constitutional: She is oriented to person, place, and time. She appears well-developed and well-nourished. No distress.  HENT:  Head: Normocephalic and atraumatic.  Eyes: Conjunctivae are normal. Right eye exhibits no discharge. Left eye exhibits no discharge. No scleral icterus.  Neck: Normal range of motion.  Respiratory: Effort normal. No respiratory distress.  GI: Soft. There is no tenderness. There is no guarding.  Mild contractions palpable -- relaxation between  Musculoskeletal:       Left wrist: Normal.       Left hand: She exhibits normal range of motion  and no deformity.  Neurological: She is alert and oriented to person, place, and time.  Skin: Skin is warm and dry. She is not diaphoretic.  Small abrasion - ring finger of left hand  Psychiatric: She has a normal mood and affect. Her behavior is normal. Judgment and thought content normal.   Dilation: Closed Effacement (%): Thick Cervical Position: Posterior Exam by:: Jorje Guild NP  Fetal Tracing:  Baseline: 140 Variability: moderate Accelerations: 10x10 Decelerations: none  Toco: 1-3 mins, 30-80 sec   MAU Course  Procedures Results for orders placed or performed during the hospital encounter of 04/13/16 (from the past 24 hour(s))  Fetal fibronectin     Status: None    Collection Time: 04/13/16  3:24 PM  Result Value Ref Range   Fetal Fibronectin NEGATIVE NEGATIVE    MDM Category 1 tracing SVE closed, FFN negative Blood type O positive per prenatal record Ctx every 1-2 minutes -- s/w Dr. Gaetano Net. Will PO hydrate & give procardia if needed.  Procardia 10 mg PO -- no improvement in ctx & pt reports SE of tachycardia, declines additional doses Normal limited ultrasound After 4 hours of monitoring, pt continues to contract every 1-2 minutes. S/w Dr. Gaetano Net who will keep patient overnight for observation  Assessment and Plan  A: 1. Fall   2. [redacted] weeks gestation of pregnancy   3. Preterm uterine contractions in second trimester, antepartum    P: Dr. Gaetano Net to speak with patient regarding POC to obs for fetal monitoring  Jorje Guild 04/13/2016, 1:13 PM

## 2016-04-14 ENCOUNTER — Encounter (HOSPITAL_COMMUNITY): Payer: Self-pay | Admitting: *Deleted

## 2016-04-14 DIAGNOSIS — O479 False labor, unspecified: Secondary | ICD-10-CM | POA: Diagnosis present

## 2016-04-14 DIAGNOSIS — W009XXA Unspecified fall due to ice and snow, initial encounter: Secondary | ICD-10-CM | POA: Diagnosis not present

## 2016-04-14 DIAGNOSIS — O4700 False labor before 37 completed weeks of gestation, unspecified trimester: Secondary | ICD-10-CM | POA: Diagnosis present

## 2016-04-14 DIAGNOSIS — Z3A26 26 weeks gestation of pregnancy: Secondary | ICD-10-CM | POA: Diagnosis not present

## 2016-04-14 DIAGNOSIS — O9A212 Injury, poisoning and certain other consequences of external causes complicating pregnancy, second trimester: Secondary | ICD-10-CM | POA: Diagnosis not present

## 2016-04-14 NOTE — Discharge Summary (Signed)
Physician Discharge Summary  Patient ID: Heather Conley MRN: [id number] DOB/AGE: 34/02/1981 34 y.o.  Admit date: 04/13/2016 Discharge date: 04/14/2016  Admission Diagnoses:S/P fall  Preterm contractions  Discharge Diagnoses:  Active Problems:   Uterine contractions during pregnancy   Preterm contractions   Discharged Condition: good  Hospital Course: placed in observation, IV fluids, BMZ x 1. Contractions resolved, patient without complaints. Cervix no change on day of discharge.  Consults: None  Significant Diagnostic Studies: labs:  Results for orders placed or performed during the hospital encounter of 04/13/16 (from the past 24 hour(s))  Urinalysis, Routine w reflex microscopic     Status: Abnormal   Collection Time: 04/13/16  2:00 PM  Result Value Ref Range   Color, Urine YELLOW YELLOW   APPearance TURBID (A) CLEAR   Specific Gravity, Urine 1.024 1.005 - 1.030   pH 6.0 5.0 - 8.0   Glucose, UA 50 (A) NEGATIVE mg/dL   Hgb urine dipstick SMALL (A) NEGATIVE   Bilirubin Urine NEGATIVE NEGATIVE   Ketones, ur NEGATIVE NEGATIVE mg/dL   Protein, ur NEGATIVE NEGATIVE mg/dL   Nitrite NEGATIVE NEGATIVE   Leukocytes, UA TRACE (A) NEGATIVE   RBC / HPF 6-30 0 - 5 RBC/hpf   WBC, UA 6-30 0 - 5 WBC/hpf   Bacteria, UA FEW (A) NONE SEEN   Squamous Epithelial / LPF 6-30 (A) NONE SEEN   Mucous PRESENT   Fetal fibronectin     Status: None   Collection Time: 04/13/16  3:24 PM  Result Value Ref Range   Fetal Fibronectin NEGATIVE NEGATIVE  ABO/Rh     Status: None   Collection Time: 04/13/16  6:44 PM  Result Value Ref Range   ABO/RH(D) O POS   Type and screen [place]     Status: None   Collection Time: 04/13/16  6:46 PM  Result Value Ref Range   ABO/RH(D) O POS    Antibody Screen NEG    Sample Expiration 04/16/2016   CBC on admission     Status: Abnormal   Collection Time: 04/13/16  6:46 PM  Result Value Ref Range   WBC 14.5 (H) 4.0 - 10.5 K/uL   RBC 4.04  3.87 - 5.11 MIL/uL   Hemoglobin 11.6 (L) 12.0 - 15.0 g/dL   HCT 34.6 (L) 36.0 - 46.0 %   MCV 85.6 78.0 - 100.0 fL   MCH 28.7 26.0 - 34.0 pg   MCHC 33.5 30.0 - 36.0 g/dL   RDW 14.1 11.5 - 15.5 %   Platelets 279 150 - 400 K/uL    Treatments: IV hydration  Discharge Exam: Blood pressure (!) 115/54, pulse 93, temperature 98.5 F (36.9 C), temperature source Oral, resp. rate 18, height 5\' 2"  (1.575 m), weight 169 lb (76.7 kg), last menstrual period 10/13/2015, SpO2 100 %. General appearance: alert, cooperative and no distress  Disposition: 01-Home or Self Care   Allergies as of 04/14/2016      Reactions   Sulfa Antibiotics    Pt states that both mother and father are allergic, she she thinks that she might be.      Medication List    TAKE these medications   albuterol 108 (90 Base) MCG/ACT inhaler Commonly known as:  VENTOLIN HFA Inhale 1-2 puffs into the lungs every 4 (four) hours as needed for wheezing or shortness of breath.   prenatal multivitamin Tabs tablet Take 1 tablet by mouth daily at 12 noon.        Signed:  Sara Keys II,Irlanda Croghan E 04/14/2016, 9:13 AM

## 2016-04-14 NOTE — Plan of Care (Signed)
Problem: Tissue Perfusion: Goal: Risk factors for ineffective tissue perfusion will decrease Outcome: Completed/Met Date Met: 04/14/16 SCD on  for (VTE) prophylaxis. Pt educated and pt verbalized understanding of same.

## 2016-04-14 NOTE — Discharge Instructions (Signed)
No vaginal entry No heavy lifting

## 2016-04-14 NOTE — Progress Notes (Signed)
No complaints Feels good  VSS Afeb Uterus soft, NT Cx closed/thick  FHT + accels UCs none this am  A/P: S/P fall         Preterm contractions now resolved         D/C home         No heavy activity         FU office 4 days

## 2016-04-18 DIAGNOSIS — Z3400 Encounter for supervision of normal first pregnancy, unspecified trimester: Secondary | ICD-10-CM | POA: Diagnosis not present

## 2016-04-18 DIAGNOSIS — Z3480 Encounter for supervision of other normal pregnancy, unspecified trimester: Secondary | ICD-10-CM | POA: Diagnosis not present

## 2016-04-18 DIAGNOSIS — Z23 Encounter for immunization: Secondary | ICD-10-CM | POA: Diagnosis not present

## 2016-05-17 ENCOUNTER — Encounter: Payer: Self-pay | Admitting: Family Medicine

## 2016-06-21 DIAGNOSIS — Z3480 Encounter for supervision of other normal pregnancy, unspecified trimester: Secondary | ICD-10-CM | POA: Diagnosis not present

## 2016-06-27 DIAGNOSIS — Z3480 Encounter for supervision of other normal pregnancy, unspecified trimester: Secondary | ICD-10-CM | POA: Diagnosis not present

## 2016-07-06 ENCOUNTER — Inpatient Hospital Stay (HOSPITAL_COMMUNITY)
Admission: AD | Admit: 2016-07-06 | Discharge: 2016-07-09 | DRG: 766 | Disposition: A | Discharge status: Home / Self Care | Payer: 59 | Source: Ambulatory Visit | Attending: Obstetrics and Gynecology | Admitting: Obstetrics and Gynecology

## 2016-07-06 ENCOUNTER — Inpatient Hospital Stay (HOSPITAL_COMMUNITY): Payer: 59 | Admitting: Anesthesiology

## 2016-07-06 ENCOUNTER — Encounter (HOSPITAL_COMMUNITY): Payer: Self-pay | Admitting: *Deleted

## 2016-07-06 ENCOUNTER — Encounter (HOSPITAL_COMMUNITY)
Admission: AD | Disposition: A | Discharge status: Home / Self Care | Payer: Self-pay | Source: Ambulatory Visit | Attending: Obstetrics and Gynecology

## 2016-07-06 DIAGNOSIS — Z3A38 38 weeks gestation of pregnancy: Secondary | ICD-10-CM

## 2016-07-06 DIAGNOSIS — O321XX0 Maternal care for breech presentation, not applicable or unspecified: Secondary | ICD-10-CM | POA: Diagnosis not present

## 2016-07-06 DIAGNOSIS — O4292 Full-term premature rupture of membranes, unspecified as to length of time between rupture and onset of labor: Secondary | ICD-10-CM | POA: Diagnosis present

## 2016-07-06 DIAGNOSIS — O42913 Preterm premature rupture of membranes, unspecified as to length of time between rupture and onset of labor, third trimester: Secondary | ICD-10-CM | POA: Diagnosis not present

## 2016-07-06 LAB — TYPE AND SCREEN
ABO/RH(D): O POS
ANTIBODY SCREEN: NEGATIVE

## 2016-07-06 LAB — CBC
HEMATOCRIT: 37.8 % (ref 36.0–46.0)
HEMOGLOBIN: 12.4 g/dL (ref 12.0–15.0)
MCH: 27.9 pg (ref 26.0–34.0)
MCHC: 32.8 g/dL (ref 30.0–36.0)
MCV: 84.9 fL (ref 78.0–100.0)
Platelets: 297 10*3/uL (ref 150–400)
RBC: 4.45 MIL/uL (ref 3.87–5.11)
RDW: 15.2 % (ref 11.5–15.5)
WBC: 11.9 10*3/uL — ABNORMAL HIGH (ref 4.0–10.5)

## 2016-07-06 SURGERY — Surgical Case
Anesthesia: Spinal

## 2016-07-06 MED ORDER — ONDANSETRON HCL 4 MG/2ML IJ SOLN
INTRAMUSCULAR | Status: AC
  Filled 2016-07-06: qty 2

## 2016-07-06 MED ORDER — MEPERIDINE HCL 25 MG/ML IJ SOLN
INTRAMUSCULAR | Status: AC
  Filled 2016-07-06: qty 1

## 2016-07-06 MED ORDER — LACTATED RINGERS IV SOLN: INTRAVENOUS | Status: DC

## 2016-07-06 MED ORDER — MORPHINE SULFATE (PF) 0.5 MG/ML IJ SOLN
INTRAMUSCULAR | Status: AC
  Filled 2016-07-06: qty 10

## 2016-07-06 MED ORDER — OXYTOCIN 10 UNIT/ML IJ SOLN
INTRAMUSCULAR | Status: AC
Start: 2016-07-06 — End: 2016-07-06
  Filled 2016-07-06: qty 4

## 2016-07-06 MED ORDER — CEFAZOLIN SODIUM-DEXTROSE 2-4 GM/100ML-% IV SOLN
2.0000 g | INTRAVENOUS | Status: DC
  Filled 2016-07-06: qty 100

## 2016-07-06 MED ORDER — FENTANYL CITRATE (PF) 100 MCG/2ML IJ SOLN
INTRAMUSCULAR | Status: DC | PRN
  Administered 2016-07-06: 50 ug via INTRAVENOUS
  Administered 2016-07-06: 12.5 ug via INTRATHECAL

## 2016-07-06 MED ORDER — CEFAZOLIN SODIUM-DEXTROSE 2-4 GM/100ML-% IV SOLN
INTRAVENOUS | Status: AC
  Filled 2016-07-06: qty 100

## 2016-07-06 MED ORDER — SOD CITRATE-CITRIC ACID 500-334 MG/5ML PO SOLN: 30.0000 mL | Freq: Once | ORAL | Status: DC

## 2016-07-06 MED ORDER — METOCLOPRAMIDE HCL 5 MG/ML IJ SOLN: 10.0000 mg | Freq: Once | INTRAMUSCULAR | Status: DC | PRN

## 2016-07-06 MED ORDER — FAMOTIDINE IN NACL 20-0.9 MG/50ML-% IV SOLN
INTRAVENOUS | Status: AC
  Administered 2016-07-06: 20 mg via INTRAVENOUS
  Filled 2016-07-06: qty 50

## 2016-07-06 MED ORDER — LACTATED RINGERS IV SOLN
INTRAVENOUS | Status: DC | PRN
  Administered 2016-07-06 (×2): via INTRAVENOUS

## 2016-07-06 MED ORDER — PHENYLEPHRINE 8 MG IN D5W 100 ML (0.08MG/ML) PREMIX OPTIME
INJECTION | INTRAVENOUS | Status: AC
  Filled 2016-07-06: qty 100

## 2016-07-06 MED ORDER — LACTATED RINGERS IV SOLN
INTRAVENOUS | Status: DC | PRN
  Administered 2016-07-06: 40 [IU] via INTRAVENOUS

## 2016-07-06 MED ORDER — MEPERIDINE HCL 25 MG/ML IJ SOLN
INTRAMUSCULAR | Status: DC | PRN
  Administered 2016-07-06: 12.5 mg via INTRAVENOUS

## 2016-07-06 MED ORDER — ONDANSETRON HCL 4 MG/2ML IJ SOLN
INTRAMUSCULAR | Status: DC | PRN
  Administered 2016-07-06: 4 mg via INTRAVENOUS

## 2016-07-06 MED ORDER — LACTATED RINGERS IV SOLN
INTRAVENOUS | Status: DC | PRN
  Administered 2016-07-06: 23:00:00 via INTRAVENOUS

## 2016-07-06 MED ORDER — MORPHINE SULFATE (PF) 0.5 MG/ML IJ SOLN
INTRAMUSCULAR | Status: DC | PRN
  Administered 2016-07-06: .2 mg via INTRATHECAL

## 2016-07-06 MED ORDER — FENTANYL CITRATE (PF) 100 MCG/2ML IJ SOLN
25.0000 ug | INTRAMUSCULAR | Status: DC | PRN
  Administered 2016-07-07: 25 ug via INTRAVENOUS

## 2016-07-06 MED ORDER — FENTANYL CITRATE (PF) 100 MCG/2ML IJ SOLN
INTRAMUSCULAR | Status: AC
  Filled 2016-07-06: qty 2

## 2016-07-06 MED ORDER — SCOPOLAMINE 1 MG/3DAYS TD PT72
MEDICATED_PATCH | TRANSDERMAL | Status: AC
Start: 2016-07-06 — End: 2016-07-06
  Filled 2016-07-06: qty 1

## 2016-07-06 MED ORDER — PHENYLEPHRINE 8 MG IN D5W 100 ML (0.08MG/ML) PREMIX OPTIME
INJECTION | INTRAVENOUS | Status: DC | PRN
  Administered 2016-07-06: 60 ug/min via INTRAVENOUS

## 2016-07-06 MED ORDER — MEPERIDINE HCL 25 MG/ML IJ SOLN: 6.2500 mg | INTRAMUSCULAR | Status: DC | PRN

## 2016-07-06 MED ORDER — SOD CITRATE-CITRIC ACID 500-334 MG/5ML PO SOLN
ORAL | Status: AC
  Filled 2016-07-06: qty 15

## 2016-07-06 MED ORDER — FAMOTIDINE IN NACL 20-0.9 MG/50ML-% IV SOLN
20.0000 mg | Freq: Once | INTRAVENOUS | Status: AC
  Administered 2016-07-06: 20 mg via INTRAVENOUS

## 2016-07-06 MED ORDER — DEXAMETHASONE SODIUM PHOSPHATE 10 MG/ML IJ SOLN
INTRAMUSCULAR | Status: DC | PRN
  Administered 2016-07-06: 10 mg via INTRAVENOUS

## 2016-07-06 MED ORDER — CEFAZOLIN SODIUM-DEXTROSE 2-3 GM-% IV SOLR
INTRAVENOUS | Status: DC | PRN
  Administered 2016-07-06: 2 g via INTRAVENOUS

## 2016-07-06 MED ORDER — BUPIVACAINE IN DEXTROSE 0.75-8.25 % IT SOLN
INTRATHECAL | Status: DC | PRN
  Administered 2016-07-06: 1.4 mL via INTRATHECAL

## 2016-07-06 MED ORDER — SODIUM CHLORIDE 0.9 % IV SOLN: INTRAVENOUS | Status: DC

## 2016-07-06 SURGICAL SUPPLY — 35 items
BENZOIN TINCTURE PRP APPL 2/3 (GAUZE/BANDAGES/DRESSINGS) ×3 IMPLANT
CHLORAPREP W/TINT 26ML (MISCELLANEOUS) ×3 IMPLANT
CLAMP CORD UMBIL (MISCELLANEOUS) IMPLANT
CLOSURE STERI STRIP 1/2 X4 (GAUZE/BANDAGES/DRESSINGS) ×3 IMPLANT
CLOTH BEACON ORANGE TIMEOUT ST (SAFETY) ×3 IMPLANT
DERMABOND ADVANCED (GAUZE/BANDAGES/DRESSINGS) ×2
DERMABOND ADVANCED .7 DNX12 (GAUZE/BANDAGES/DRESSINGS) ×1 IMPLANT
DRSG OPSITE POSTOP 4X10 (GAUZE/BANDAGES/DRESSINGS) ×3 IMPLANT
ELECT REM PT RETURN 9FT ADLT (ELECTROSURGICAL) ×3
ELECTRODE REM PT RTRN 9FT ADLT (ELECTROSURGICAL) ×1 IMPLANT
EXTRACTOR VACUUM KIWI (MISCELLANEOUS) IMPLANT
GLOVE BIO SURGEON STRL SZ 6.5 (GLOVE) ×2 IMPLANT
GLOVE BIO SURGEONS STRL SZ 6.5 (GLOVE) ×1
GLOVE BIOGEL PI IND STRL 6.5 (GLOVE) ×1 IMPLANT
GLOVE BIOGEL PI IND STRL 7.0 (GLOVE) ×2 IMPLANT
GLOVE BIOGEL PI INDICATOR 6.5 (GLOVE) ×2
GLOVE BIOGEL PI INDICATOR 7.0 (GLOVE) ×4
GOWN STRL REUS W/TWL LRG LVL3 (GOWN DISPOSABLE) ×6 IMPLANT
KIT ABG SYR 3ML LUER SLIP (SYRINGE) ×3 IMPLANT
NEEDLE HYPO 25X5/8 SAFETYGLIDE (NEEDLE) ×3 IMPLANT
NS IRRIG 1000ML POUR BTL (IV SOLUTION) ×3 IMPLANT
PACK C SECTION WH (CUSTOM PROCEDURE TRAY) ×3 IMPLANT
PAD OB MATERNITY 4.3X12.25 (PERSONAL CARE ITEMS) ×3 IMPLANT
PENCIL SMOKE EVAC W/HOLSTER (ELECTROSURGICAL) ×3 IMPLANT
RETRACTOR WND ALEXIS 25 LRG (MISCELLANEOUS) IMPLANT
RTRCTR WOUND ALEXIS 25CM LRG (MISCELLANEOUS)
SUT PLAIN 0 NONE (SUTURE) IMPLANT
SUT PLAIN 2 0 (SUTURE) ×2
SUT PLAIN ABS 2-0 CT1 27XMFL (SUTURE) ×1 IMPLANT
SUT VIC AB 0 CT1 36 (SUTURE) ×3 IMPLANT
SUT VIC AB 0 CTX 36 (SUTURE) ×4
SUT VIC AB 0 CTX36XBRD ANBCTRL (SUTURE) ×2 IMPLANT
SUT VIC AB 4-0 KS 27 (SUTURE) IMPLANT
TOWEL OR 17X24 6PK STRL BLUE (TOWEL DISPOSABLE) ×3 IMPLANT
TRAY FOLEY BAG SILVER LF 14FR (SET/KITS/TRAYS/PACK) IMPLANT

## 2016-07-06 NOTE — MAU Provider Note (Signed)
Pt informed that the ultrasound is considered a limited OB ultrasound and is not intended to be a complete ultrasound exam.  Patient also informed that the ultrasound is not being completed with the intent of assessing for fetal or placental anomalies or any pelvic abnormalities.  Explained that the purpose of today's ultrasound is to assess for Presentation.  Patient acknowledges the purpose of the exam and the limitations of the study.    Single IUP FHR - 150s Fetal head is to maternal right Back is down Breech presentation  Seabron Spates, CNM

## 2016-07-06 NOTE — Transfer of Care (Signed)
Immediate Anesthesia Transfer of Care Note  Patient: Heather Conley  Procedure(s) Performed: Procedure(s): CESAREAN SECTION (N/A)  Patient Location: PACU  Anesthesia Type:Spinal  Level of Consciousness: awake, alert , oriented and patient cooperative  Airway & Oxygen Therapy: Patient Spontanous Breathing  Post-op Assessment: Report given to RN and Post -op Vital signs reviewed and stable  Post vital signs: Reviewed and stable  Last Vitals:  Vitals:   07/06/16 2116  Pulse: 94  Resp: 18  Temp: 36.9 C    Last Pain: There were no vitals filed for this visit.       Complications: No apparent anesthesia complications

## 2016-07-06 NOTE — Op Note (Signed)
PROCEDURE DATE: 07/06/2016  PREOPERATIVE DIAGNOSIS: Intrauterine pregnancy at 38.1 wga, Indication: breech presentation, s/p PROM  POSTOPERATIVE DIAGNOSIS:The same  PROCEDURE: primary Low TransverseCesarean Section  SURGEON: Dr. Lucillie Garfinkel  INDICATIONS:This is a 36yo G1p0 at 38.1 wga requiring cesarean section secondary to breech presentation after presenting in the MAU s/p PROM. Decision made to proceed with pLTCS.The risks of cesarean section discussed with the patient included but were not limited to: bleeding which may require transfusion or reoperation; infection which may require antibiotics; injury to bowel, bladder, ureters or other surrounding organs; injury to the fetus; need for additional procedures including hysterectomy in the event of a life-threatening hemorrhage; placental abnormalities wth subsequent pregnancies, incisional problems, thromboembolic phenomenon and other postoperative/anesthesia complications. The patient agreed with the proposed plan, giving informed consent for the procedure.   FINDINGS: Viable femaleinfant in frank breech presentation,APGARs pending, Weight pending, Amniotic fluid clear, Intact placenta, three vessel cord. Grossly normal uterus, ovaries and fallopian tubes. .  ANESTHESIA: Epidural ESTIMATED BLOOD LOSS: 400cc SPECIMENS: Placenta for L&D COMPLICATIONS: None immediate   PROCEDURE IN DETAIL: The patient received intravenous antibiotics (2g Ancef) and had sequential compression devices applied to her lower extremities while in the preoperative area. Shewasthen taken to the operating roomwhere epidural anesthesiawas dosed up to surgical level andwas found to be adequate. She was then placed in a dorsal supine position with a leftward tilt,and prepped and draped in a sterile manner.A foley catheter was placed into her bladder and attached to constant gravity. After an adequate timeout was performed, aPfannenstiel  skin incision was made with scalpel and carried through to the underlying layer of fascia. The fascia was incised in the midline and this incision was extended bilaterally using the Mayo scissors. Kocher clamps were applied to the superior aspect of the fascial incision and the underlying rectus muscles were dissected off bluntly. A similar process was carried out on the inferior aspect of the facial incision. The rectus muscles were separated in the midline bluntly and the peritoneum was entered bluntly. A bladder flap was created sharply and developed bluntly.Atransverse hysterotomy was made with a scalpel and extended bilaterally bluntly. The bladder blade was then removed. The infant was successfully delivered with usual breech manuevers, and cord was clamped and cut and infant was handed over to awaiting neonatology team. Uterine massage was then administered and the placenta delivered intact with three-vessel cord. Cord gases were taken. The uterus was cleared of clot and debris. The hysterotomy was closed with 0 vicryl.A second imbricating suture of 0-vicryl was used to reinforce the incision and aid in hemostasis.The fascia was closed with 0-Vicryl in a running fashion with good restoration of anatomy. The subcutaneus tissue was irrigated and was reapproximated using three interrupted plain gut stitches. The skin was closed with 4-0 Vicryl in a subcuticular fashion.  Final EBL was 400cc (all surgical site and was hemostatic at end of procedure) without any further bleeding on exam.   Pt tolerated the procedure well. All sponge/lap/needle counts were correct X 2. Pt taken to recovery room in stable condition.  It's a girl - "[place]) Shirlee [place]" to join mom and [place] (husband)!!  Lucillie Garfinkel MD

## 2016-07-06 NOTE — Anesthesia Procedure Notes (Signed)
Spinal  Patient location during procedure: OR Staffing Anesthesiologist: Desirre Eickhoff Performed: anesthesiologist  Preanesthetic Checklist Completed: patient identified, site marked, surgical consent, pre-op evaluation, timeout performed, IV checked, risks and benefits discussed and monitors and equipment checked Spinal Block Patient position: sitting Prep: DuraPrep Patient monitoring: heart rate, continuous pulse ox and blood pressure Approach: midline Location: L3-4 Injection technique: single-shot Needle Needle type: Sprotte  Needle gauge: 24 G Needle length: 9 cm Additional Notes Expiration date of kit checked and confirmed. Patient tolerated procedure well, without complications.       

## 2016-07-06 NOTE — MAU Note (Signed)
Pt was at birthing class and he water broke. Clear fluid out. Some cramping noted.

## 2016-07-06 NOTE — H&P (Signed)
Heather Conley is a 36 y.o. female presenting for SROM. Was in birthing class and broke her bag. Is Breech and desires pCS. OB History    Gravida Para Term Preterm AB Living   1             SAB TAB Ectopic Multiple Live Births                 Past Medical History:  Diagnosis Date  . GAD (generalized anxiety disorder)    zoloft in the past helped but sexual side effects caused her to d/c it.  . Gastritis Mar/Apr 2014  . GERD (gastroesophageal reflux disease)    chronic cough--eval by Dr. Shoemaker-ENT-07/22/13.  . Microscopic hematuria    Exam, labs, cysto, and imaging all UNREMARKABLE with Dr. Tresa Moore all NEG--pt to get further eval only if gross hematuria occurs.  . 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 WISDOM TOOTH EXTRACTION  approx 9826   No complications   Family History: family history includes Alcohol abuse in her mother; Heart disease in her maternal grandfather and maternal uncle; Stroke in her paternal grandmother. Social History:  reports that she has never smoked. She has never used smokeless tobacco. She reports that she drinks alcohol. She reports that she does not use drugs.     Maternal Diabetes: No Genetic Screening: Normal Maternal Ultrasounds/Referrals: Normal Fetal Ultrasounds or other Referrals:  None Maternal Substance Abuse:  No Significant Maternal Medications:  None Significant Maternal Lab Results:  None Other Comments:  None  ROS History   Pulse 94, temperature 98.5 F (36.9 C), resp. rate 18, height 5\' 2"  (1.575 m), weight 88.9 kg (196 lb), last menstrual period 10/13/2015. Exam  Closed  Physical Exam  NAD, A&O NWOB Abd soft, nondistended, gravid  Prenatal labs: ABO, Rh: --/--/O POS (01/19 1846) Antibody: NEG (01/19 1846) Rubella:   RPR:    HBsAg:    HIV:    GBS:   neg per pt report  Assessment/Plan: 36yo G1P0 @ 38.1 wga presenting for PROM, known to be breech. +  contractions, likely early labor. Desires pCS. Risks discussed including infection, bleeding, damage to surrounding structures, and need for additional procedures. All questions answered. Consent signed. Pt desires to proceed with above surgery.    Lucillie Garfinkel MD    Colin Benton Jaymz Traywick 07/06/2016, 9:34 PM

## 2016-07-06 NOTE — Anesthesia Preprocedure Evaluation (Signed)
Anesthesia Evaluation  Patient identified by MRN, date of birth, ID band Patient awake    Reviewed: Allergy & Precautions, NPO status , Patient's Chart, lab work & pertinent test results  Airway Mallampati: II  TM Distance: >3 FB Neck ROM: Full    Dental no notable dental hx.    Pulmonary neg pulmonary ROS,    Pulmonary exam normal breath sounds clear to auscultation       Cardiovascular negative cardio ROS Normal cardiovascular exam Rhythm:Regular Rate:Normal     Neuro/Psych negative neurological ROS  negative psych ROS   GI/Hepatic negative GI ROS, Neg liver ROS,   Endo/Other  negative endocrine ROS  Renal/GU negative Renal ROS  negative genitourinary   Musculoskeletal negative musculoskeletal ROS (+)   Abdominal   Peds negative pediatric ROS (+)  Hematology negative hematology ROS (+)   Anesthesia Other Findings   Reproductive/Obstetrics (+) Pregnancy                             Anesthesia Physical Anesthesia Plan  ASA: II and emergent  Anesthesia Plan: Spinal   Post-op Pain Management:    Induction:   Airway Management Planned:   Additional Equipment:   Intra-op Plan:   Post-operative Plan:   Informed Consent: I have reviewed the patients History and Physical, chart, labs and discussed the procedure including the risks, benefits and alternatives for the proposed anesthesia with the patient or authorized representative who has indicated his/her understanding and acceptance.   Dental advisory given  Plan Discussed with: CRNA  Anesthesia Plan Comments:         Anesthesia Quick Evaluation

## 2016-07-06 NOTE — Brief Op Note (Signed)
07/06/2016  10:33 PM  PATIENT:  Heather Conley  36 y.o. female  PRE-OPERATIVE DIAGNOSIS:  malpresentation breech   POST-OPERATIVE DIAGNOSIS:  malpresentation breech  PROCEDURE:  Procedure(s): CESAREAN SECTION (N/A)  SURGEON:  Surgeon(s) and Role:    * Tyson Dense, MD - Primary  PHYSICIAN ASSISTANT:   ASSISTANTS: none   ANESTHESIA:   spinal  EBL:  No intake/output data recorded. 400cc  BLOOD ADMINISTERED:none  DRAINS: Urinary Catheter (Foley)   LOCAL MEDICATIONS USED:  NONE  SPECIMEN:  Source of Specimen:  placenta  DISPOSITION OF SPECIMEN:  L&D  COUNTS:  YES  TOURNIQUET:  * No tourniquets in log *  DICTATION: .Note written in EPIC  PLAN OF CARE: Admit to inpatient   PATIENT DISPOSITION:  PACU - hemodynamically stable.   Delay start of Pharmacological VTE agent (>24hrs) due to surgical blood loss or risk of bleeding: not applicable

## 2016-07-07 LAB — CBC
HCT: 36.3 % (ref 36.0–46.0)
Hemoglobin: 12 g/dL (ref 12.0–15.0)
MCH: 28 pg (ref 26.0–34.0)
MCHC: 33.1 g/dL (ref 30.0–36.0)
MCV: 84.6 fL (ref 78.0–100.0)
PLATELETS: 267 10*3/uL (ref 150–400)
RBC: 4.29 MIL/uL (ref 3.87–5.11)
RDW: 15.1 % (ref 11.5–15.5)
WBC: 17.7 10*3/uL — AB (ref 4.0–10.5)

## 2016-07-07 LAB — RPR: RPR Ser Ql: NONREACTIVE

## 2016-07-07 MED ORDER — KETOROLAC TROMETHAMINE 30 MG/ML IJ SOLN
30.0000 mg | Freq: Four times a day (QID) | INTRAMUSCULAR | Status: AC | PRN
  Administered 2016-07-07: 30 mg via INTRAVENOUS
  Filled 2016-07-07 (×3): qty 1

## 2016-07-07 MED ORDER — OXYTOCIN 40 UNITS IN LACTATED RINGERS INFUSION - SIMPLE MED: 2.5000 [IU]/h | INTRAVENOUS | Status: AC

## 2016-07-07 MED ORDER — SIMETHICONE 80 MG PO CHEW: 80.0000 mg | CHEWABLE_TABLET | ORAL | Status: DC | PRN

## 2016-07-07 MED ORDER — DIBUCAINE 1 % RE OINT: 1.0000 "application " | TOPICAL_OINTMENT | RECTAL | Status: DC | PRN

## 2016-07-07 MED ORDER — ONDANSETRON HCL 4 MG/2ML IJ SOLN: 4.0000 mg | Freq: Three times a day (TID) | INTRAMUSCULAR | Status: DC | PRN

## 2016-07-07 MED ORDER — OXYCODONE HCL 5 MG PO TABS: 5.0000 mg | ORAL_TABLET | ORAL | Status: DC | PRN

## 2016-07-07 MED ORDER — LACTATED RINGERS IV BOLUS (SEPSIS)
500.0000 mL | Freq: Once | INTRAVENOUS | Status: AC
  Administered 2016-07-07: 500 mL via INTRAVENOUS

## 2016-07-07 MED ORDER — OXYCODONE HCL 5 MG PO TABS: 10.0000 mg | ORAL_TABLET | ORAL | Status: DC | PRN

## 2016-07-07 MED ORDER — NALOXONE HCL 2 MG/2ML IJ SOSY
1.0000 ug/kg/h | PREFILLED_SYRINGE | INTRAVENOUS | Status: DC | PRN
  Filled 2016-07-07: qty 2

## 2016-07-07 MED ORDER — NALOXONE HCL 0.4 MG/ML IJ SOLN: 0.4000 mg | INTRAMUSCULAR | Status: DC | PRN

## 2016-07-07 MED ORDER — SODIUM CHLORIDE 0.9% FLUSH: 3.0000 mL | INTRAVENOUS | Status: DC | PRN

## 2016-07-07 MED ORDER — FENTANYL CITRATE (PF) 100 MCG/2ML IJ SOLN
INTRAMUSCULAR | Status: AC
  Filled 2016-07-07: qty 2

## 2016-07-07 MED ORDER — DIPHENHYDRAMINE HCL 25 MG PO CAPS: 25.0000 mg | ORAL_CAPSULE | ORAL | Status: DC | PRN

## 2016-07-07 MED ORDER — TETANUS-DIPHTH-ACELL PERTUSSIS 5-2.5-18.5 LF-MCG/0.5 IM SUSP: 0.5000 mL | Freq: Once | INTRAMUSCULAR | Status: DC

## 2016-07-07 MED ORDER — WITCH HAZEL-GLYCERIN EX PADS
1.0000 | MEDICATED_PAD | CUTANEOUS | Status: DC | PRN
Start: 2016-07-07 — End: 2016-07-09

## 2016-07-07 MED ORDER — DIPHENHYDRAMINE HCL 50 MG/ML IJ SOLN: 12.5000 mg | INTRAMUSCULAR | Status: DC | PRN

## 2016-07-07 MED ORDER — NALBUPHINE HCL 10 MG/ML IJ SOLN: 5.0000 mg | INTRAMUSCULAR | Status: DC | PRN

## 2016-07-07 MED ORDER — SENNOSIDES-DOCUSATE SODIUM 8.6-50 MG PO TABS
2.0000 | ORAL_TABLET | ORAL | Status: DC
  Administered 2016-07-07 – 2016-07-08 (×2): 2 via ORAL
  Filled 2016-07-07: qty 2

## 2016-07-07 MED ORDER — OXYTOCIN 40 UNITS IN LACTATED RINGERS INFUSION - SIMPLE MED
INTRAVENOUS | Status: AC
  Administered 2016-07-07: 01:00:00
  Filled 2016-07-07: qty 1000

## 2016-07-07 MED ORDER — NALBUPHINE HCL 10 MG/ML IJ SOLN: 5.0000 mg | Freq: Once | INTRAMUSCULAR | Status: DC | PRN

## 2016-07-07 MED ORDER — ACETAMINOPHEN 325 MG PO TABS
650.0000 mg | ORAL_TABLET | ORAL | Status: DC | PRN
  Administered 2016-07-07: 650 mg via ORAL
  Filled 2016-07-07: qty 2

## 2016-07-07 MED ORDER — SIMETHICONE 80 MG PO CHEW
80.0000 mg | CHEWABLE_TABLET | ORAL | Status: DC
  Administered 2016-07-07: 80 mg via ORAL
  Filled 2016-07-07: qty 1

## 2016-07-07 MED ORDER — MENTHOL 3 MG MT LOZG: 1.0000 | LOZENGE | OROMUCOSAL | Status: DC | PRN

## 2016-07-07 MED ORDER — SIMETHICONE 80 MG PO CHEW
80.0000 mg | CHEWABLE_TABLET | Freq: Three times a day (TID) | ORAL | Status: DC
  Administered 2016-07-07 – 2016-07-08 (×3): 80 mg via ORAL
  Filled 2016-07-07 (×6): qty 1

## 2016-07-07 MED ORDER — KETOROLAC TROMETHAMINE 30 MG/ML IJ SOLN: 30.0000 mg | Freq: Four times a day (QID) | INTRAMUSCULAR | Status: AC | PRN

## 2016-07-07 MED ORDER — ZOLPIDEM TARTRATE 5 MG PO TABS: 5.0000 mg | ORAL_TABLET | Freq: Every evening | ORAL | Status: DC | PRN

## 2016-07-07 MED ORDER — SCOPOLAMINE 1 MG/3DAYS TD PT72
1.0000 | MEDICATED_PATCH | Freq: Once | TRANSDERMAL | Status: DC
  Filled 2016-07-07: qty 1

## 2016-07-07 MED ORDER — DIPHENHYDRAMINE HCL 25 MG PO CAPS: 25.0000 mg | ORAL_CAPSULE | Freq: Four times a day (QID) | ORAL | Status: DC | PRN

## 2016-07-07 MED ORDER — LACTATED RINGERS IV SOLN: INTRAVENOUS | Status: DC

## 2016-07-07 MED ORDER — PRENATAL MULTIVITAMIN CH
1.0000 | ORAL_TABLET | Freq: Every day | ORAL | Status: DC
  Administered 2016-07-07 – 2016-07-08 (×2): 1 via ORAL
  Filled 2016-07-07 (×2): qty 1

## 2016-07-07 MED ORDER — COCONUT OIL OIL: 1.0000 "application " | TOPICAL_OIL | Status: DC | PRN

## 2016-07-07 MED ORDER — IBUPROFEN 600 MG PO TABS
600.0000 mg | ORAL_TABLET | Freq: Four times a day (QID) | ORAL | Status: DC
  Administered 2016-07-07 – 2016-07-09 (×5): 600 mg via ORAL
  Filled 2016-07-07 (×6): qty 1

## 2016-07-07 NOTE — Progress Notes (Signed)
MOB was referred for history of anxiety.  Referral is screened out by Clinical Social Worker because none of the following criteria appear to apply and there are no reports impacting the pregnancy or her transition to the postpartum period.  CSW does not deem it clinically necessary to further investigate at this time.   -History of anxiety/depression during this pregnancy, or of post-partum depression. - Diagnosis of anxiety and/or depression within last 3 years. - History of depression due to pregnancy loss/loss of child. or -MOB's symptoms are currently being treated with medication and/or therapy.  CSW completed chart review to include [facility] records and saw no mention of hx of anxiety. Please contact the Clinical Social Worker if needs arise or upon MOB request.    Oda Cogan, MSW, [facility]  Office: [phone]

## 2016-07-07 NOTE — Lactation Note (Signed)
This note was copied from a baby's chart. Lactation Consultation Note New mom needing a lot of assistance. c/s mom has short shaft, everts well w/stimulation, compressible. Rt. [place]ola bulbous w/more everted nipple than Lt.  Hand expression taught w/thick colostrum noted. Collected 0.17ml Baby breech w/a lot of LE bruising. Reviewed briefly on jaundice risk, increase I&O.  Baby has high arched bubbled palate. Tight upper labial frenulum to bottom of top gum line. Baby moves tongue past gum and out past lips.  Mom stated baby gets on nipple tip a lot and bites. Demonstrated chin tug and obtaining deeper latch.  Educated on newborn behavior and feeding habits. Mom encouraged to feed baby 8-12 times/24 hours and with feeding cues. Mom encouraged to waken baby for feeds.  Shells given to evert nipples for deeper latch. FOB bringing bra.  Mom had a lot of questions and needed a lot of positional assistance. Encouraged STS, I&O, & hand expression. Educated cluster feeding, supply and demand.  Encouraged to call for assistance or further questions. [place] brochure given w/resources, support groups and [place] services. Patient Name: Heather Conley [id number] Date: 07/07/2016 Reason for consult: Initial assessment   Maternal Data Has patient been taught Hand Expression?: Yes Does the patient have breastfeeding experience prior to this delivery?: No  Feeding Feeding Type: Breast Fed Length of feed: 15 min  LATCH Score/Interventions Latch: Repeated attempts needed to sustain latch, nipple held in mouth throughout feeding, stimulation needed to elicit sucking reflex. Intervention(s): Adjust position;Assist with latch;Breast massage;Breast compression  Audible Swallowing: A few with stimulation Intervention(s): Skin to skin;Hand expression Intervention(s): Alternate breast massage  Type of Nipple: Everted at rest and after stimulation  Comfort (Breast/Nipple): Soft / non-tender     Hold  (Positioning): Full assist, staff holds infant at breast Intervention(s): Breastfeeding basics reviewed;Support Pillows;Position options;Skin to skin  LATCH Score: 6  Lactation Tools Discussed/Used Tools: Pump;Shells Shell Type: Inverted Breast pump type: Manual WIC Program: No Pump Review: Setup, frequency, and cleaning;Milk Storage Initiated by:: Allayne Stack RN IBCLC Date initiated:: 07/07/16   Consult Status Consult Status: Follow-up Date: 07/08/16 Follow-up type: In-patient    Theodoro Kalata 07/07/2016, 12:49 PM

## 2016-07-07 NOTE — Progress Notes (Signed)
S: No complaints. Feeling well. Lochia appropriate. No subjective fevers/chills. Denies SOB, Cp, worsening swelling, etc.    O:  Vitals:   07/07/16 0330 07/07/16 0423  BP: (!) 95/49 (!) 115/45  Pulse: 76 66  Resp: 18 18  Temp: 98.9 F (37.2 C) 98.9 F (37.2 C)    Gen: NAD, A&O Pulm: NWOB Abd: soft, appropriately ttp, fundus firm and below Umb, incision c/d/i Ext: No evidence of DVT, trace edema b/l  Labs CBC    Component Value Date/Time   WBC 17.7 (H) 07/07/2016 0515   RBC 4.29 07/07/2016 0515   HGB 12.0 07/07/2016 0515   HGB 13.4 01/01/2012 1110   HCT 36.3 07/07/2016 0515   HCT 39.3 01/01/2012 1110   PLT 267 07/07/2016 0515   PLT 247 01/01/2012 1110   MCV 84.6 07/07/2016 0515   MCV 86 01/01/2012 1110   MCH 28.0 07/07/2016 0515   MCHC 33.1 07/07/2016 0515   RDW 15.1 07/07/2016 0515   RDW 13.3 01/01/2012 1110   LYMPHSABS 3.1 05/13/2015 0955   LYMPHSABS 2.3 01/01/2012 1110   MONOABS 0.6 05/13/2015 0955   MONOABS 0.9 01/01/2012 1110   EOSABS 0.3 05/13/2015 0955   EOSABS 0.3 01/01/2012 1110   BASOSABS 0.1 05/13/2015 0955   BASOSABS 0.1 01/01/2012 1110      A/P:  POD1 s/p pTCS for breech presentiong, doing well pp. AFVSS. Benign exam.   # Postpartum: no issues  # Postop: doing well  # Dispo: Continue present care. Plan for d/c POD#2-3  Lucillie Garfinkel MD

## 2016-07-07 NOTE — Anesthesia Postprocedure Evaluation (Signed)
Anesthesia Post Note  Patient: Heather Conley  Procedure(s) Performed: Procedure(s) (LRB): CESAREAN SECTION (N/A)  Patient location during evaluation: Mother Baby Anesthesia Type: Spinal Level of consciousness: oriented and awake and alert Pain management: pain level controlled Vital Signs Assessment: post-procedure vital signs reviewed and stable Respiratory status: spontaneous breathing, respiratory function stable and patient connected to nasal cannula oxygen Cardiovascular status: blood pressure returned to baseline and stable Postop Assessment: no headache, no backache and patient able to bend at knees Anesthetic complications: no        Last Vitals:  Vitals:   07/07/16 0330 07/07/16 0423  BP: (!) 95/49 (!) 115/45  Pulse: 76 66  Resp: 18 18  Temp: 37.2 C 37.2 C    Last Pain:  Vitals:   07/07/16 0600  TempSrc:   PainSc: 4    Pain Goal:                 Rayvon Char

## 2016-07-08 ENCOUNTER — Encounter (HOSPITAL_COMMUNITY): Payer: Self-pay | Admitting: Obstetrics and Gynecology

## 2016-07-08 NOTE — Lactation Note (Signed)
This note was copied from a baby's chart. Lactation Consultation Note  Patient Name: Heather Conley [id number] Date: 07/08/2016 Reason for consult: Follow-up assessment   Baby 67 hours old and mother states her nipple are getting sore. Bruising on R side.   Assisted w/ latching in cross cradle.  Baby is eager to breast fed but upper lip is tight leaving red circle on face. Taught mother how to flange lips.  Mother states discomfort improves after a few minutes of latching. Encouraged mother to compress breast often during feeding to achieve a deeper latch. Mother has personal nipple cream to put on nipples.  Also encouraged ebm.    Maternal Data    Feeding Feeding Type: Breast Fed  LATCH Score/Interventions Latch: Grasps breast easily, tongue down, lips flanged, rhythmical sucking. Intervention(s): Breast massage;Assist with latch;Adjust position  Audible Swallowing: A few with stimulation Intervention(s): Skin to skin;Hand expression  Type of Nipple: Everted at rest and after stimulation  Comfort (Breast/Nipple): Filling, red/small blisters or bruises, mild/mod discomfort  Problem noted: Mild/Moderate discomfort Interventions (Mild/moderate discomfort): Hand expression (personal nipple cream)  Hold (Positioning): Assistance needed to correctly position infant at breast and maintain latch.  LATCH Score: 7  Lactation Tools Discussed/Used     Consult Status Consult Status: Follow-up Date: 07/09/16 Follow-up type: In-patient    Vivianne Master [facility] 07/08/2016, 3:32 PM

## 2016-07-08 NOTE — Progress Notes (Signed)
S: No complaints except difficulty BF-ing. Feeling well. Lochia appropriate. Pain c/w PO pain meds. No subjective fevers/chills. Denies SOB, Cp, worsening swelling, etc.    O:      Vitals:   07/07/16 0330 07/07/16 0423  BP: (!) 95/49 (!) 115/45  Pulse: 76 66  Resp: 18 18  Temp: 98.9 F (37.2 C) 98.9 F (37.2 C)    Gen: NAD, A&O Pulm: NWOB Abd: soft, appropriately ttp, fundus firm and below Umb, incision c/d/i Ext: No evidence of DVT, trace edema b/l  Labs CBC Labs (Brief)          Component Value Date/Time   WBC 17.7 (H) 07/07/2016 0515   RBC 4.29 07/07/2016 0515   HGB 12.0 07/07/2016 0515   HGB 13.4 01/01/2012 1110   HCT 36.3 07/07/2016 0515   HCT 39.3 01/01/2012 1110   PLT 267 07/07/2016 0515   PLT 247 01/01/2012 1110   MCV 84.6 07/07/2016 0515   MCV 86 01/01/2012 1110   MCH 28.0 07/07/2016 0515   MCHC 33.1 07/07/2016 0515   RDW 15.1 07/07/2016 0515   RDW 13.3 01/01/2012 1110   LYMPHSABS 3.1 05/13/2015 0955   LYMPHSABS 2.3 01/01/2012 1110   MONOABS 0.6 05/13/2015 0955   MONOABS 0.9 01/01/2012 1110   EOSABS 0.3 05/13/2015 0955   EOSABS 0.3 01/01/2012 1110   BASOSABS 0.1 05/13/2015 0955   BASOSABS 0.1 01/01/2012 1110        A/P:  POD2 s/p pTCS for breech presentiong, doing well pp. AFVSS. Benign exam.   # Postpartum: no issues except trouble BF-ing.  # Postop: doing well  # Dispo: Continue present care. Plan for d/c likely tmrw s/s BF-ing issues.   Lucillie Garfinkel MD

## 2016-07-09 MED ORDER — IBUPROFEN 600 MG PO TABS: 600.0000 mg | ORAL_TABLET | Freq: Four times a day (QID) | ORAL | 1 refills | Status: DC

## 2016-07-09 MED ORDER — OXYCODONE HCL 5 MG PO TABS: 5.0000 mg | ORAL_TABLET | ORAL | 0 refills | Status: DC | PRN

## 2016-07-09 MED FILL — IBUPROFEN 600 MG TABLET: 600 | 8 days supply | Qty: 30 | Fill #0

## 2016-07-09 MED FILL — oxyCODONE HCL 5 MG TABS: 5 | 5 days supply | Qty: 30 | Fill #0

## 2016-07-09 NOTE — Lactation Note (Signed)
This note was copied from a baby's chart. Lactation Consultation Note  Patient Name: Heather Conley SLHTD'S Date: 07/09/2016 Reason for consult: Follow-up assessment;Infant weight loss;Breast/nipple pain (7% weight loss )  Baby is 56 hours old and has been consistent at the breast.  Per mom breast are fuller today and nipples are sensitive.  LC assessed with moms permission , reviewed hand expressing, several large drops of colostrum noted. LC had mom apply EBM to nipples.  LC reviewed steps for latching - breast massage, hand express, pre - pump if needed , breast compressions with latch until comfort achieved and swallows , firm support.  Sore nipple and engorgement prevention and tx reviewed. LC reviewed the use of shells , hand pump . Also per mom has a DEBP at home.  LC discussed nutritive vs non- nutritive feeding patterns, and the importance of watching for hanging out at the breast. Per mom has noticed the baby doing that at some feedings.  LC mentioned to mom since the baby  has 7% weight loss , cluster feeding is normal.  And it is important not to go over 3 hours without feeding.  Mother informed of post-discharge support and given phone number to the lactation department, including services for phone call assistance; out-patient appointments; and breastfeeding support group. List of other breastfeeding resources in the community given in the handout. Encouraged mother to call for problems or concerns related to breastfeeding.    Maternal Data Has patient been taught Hand Expression?: Yes (LC reviewed hand expressing , and milk expressed well , mom applied to nipples )  Feeding Feeding Type: Breast Fed Length of feed: 45 min  LATCH Score/Interventions          Comfort (Breast/Nipple): Filling, red/small blisters or bruises, mild/mod discomfort  Problem noted: Mild/Moderate discomfort;Filling  Intervention(s): Breastfeeding basics reviewed     Lactation Tools  Discussed/Used Tools: Shells;Pump Shell Type: Inverted Breast pump type: Manual Pump Review: Setup, frequency, and cleaning;Milk Storage (LC reviewed)   Consult Status Consult Status: Follow-up Date: 07/09/16    Myer Haff 07/09/2016, 10:38 AM

## 2016-07-09 NOTE — Discharge Summary (Signed)
Obstetric Discharge Summary Reason for Admission: rupture of membranes Prenatal Procedures: ultrasound Intrapartum Procedures: cesarean: low cervical, transverse Postpartum Procedures: none Complications-Operative and Postpartum: none Hemoglobin  Date Value Ref Range Status  07/07/2016 12.0 12.0 - 15.0 g/dL Final   HGB  Date Value Ref Range Status  01/01/2012 13.4 12.0 - 16.0 g/dL Final   HCT  Date Value Ref Range Status  07/07/2016 36.3 36.0 - 46.0 % Final  01/01/2012 39.3 35.0 - 47.0 % Final    Physical Exam:  General: alert and cooperative Lochia: appropriate Uterine Fundus: firm Incision: healing well DVT Evaluation: No evidence of DVT seen on physical exam. Negative Homan's sign. No cords or calf tenderness. Calf/Ankle edema is present.  Discharge Diagnoses: Term Pregnancy-delivered  Discharge Information: Date: 07/09/2016 Activity: pelvic rest Diet: routine Medications: PNV, Ibuprofen and Percocet Condition: stable Instructions: refer to practice specific booklet Discharge to: home   Newborn Data: Live born female  Birth Weight: 8 lb 1.3 oz (3665 g) APGAR: 8, 9  Home with mother.  Sammye Staff G 07/09/2016, 8:50 AM

## 2016-07-17 ENCOUNTER — Encounter (HOSPITAL_COMMUNITY): Admission: RE | Payer: Self-pay | Source: Ambulatory Visit

## 2016-07-17 ENCOUNTER — Inpatient Hospital Stay (HOSPITAL_COMMUNITY): Admission: RE | Admit: 2016-07-17 | Payer: 59 | Source: Ambulatory Visit | Admitting: Obstetrics and Gynecology

## 2016-07-17 SURGERY — Surgical Case
Anesthesia: Regional

## 2016-08-15 DIAGNOSIS — Z1389 Encounter for screening for other disorder: Secondary | ICD-10-CM | POA: Diagnosis not present

## 2016-08-15 DIAGNOSIS — Z3480 Encounter for supervision of other normal pregnancy, unspecified trimester: Secondary | ICD-10-CM | POA: Diagnosis not present

## 2016-08-15 MED FILL — NORETHINDRONE 0.35 MG TAB: 0.35 | 28 days supply | Qty: 28 | Fill #0

## 2016-08-16 ENCOUNTER — Ambulatory Visit (INDEPENDENT_AMBULATORY_CARE_PROVIDER_SITE_OTHER): Payer: 59 | Admitting: Family Medicine

## 2016-08-16 ENCOUNTER — Encounter: Payer: Self-pay | Admitting: Family Medicine

## 2016-08-16 VITALS — BP 103/70 | HR 69 | Temp 98.1°F | Resp 16 | Ht 62.0 in | Wt 162.0 lb

## 2016-08-16 DIAGNOSIS — M26621 Arthralgia of right temporomandibular joint: Secondary | ICD-10-CM | POA: Diagnosis not present

## 2016-08-16 NOTE — Progress Notes (Signed)
OFFICE VISIT  08/16/2016   CC:  Chief Complaint  Patient presents with  . Ear Pain    right   HPI:    Patient is a 36 y.o. Caucasian female who presents for right ear pain. Hx of ruptured TM 3 yrs ago when using neti pot.  Since then has had periods of R ear irritation, esp with loud noises. Sometimes feels tenderness around R anterior ear soft tissue.  Says it feels like that area just anterior to R ear is "swollen".  No erythema. Dental mouth guard given to her by dentist a few weeks ago but she has not worn it yet.  Some R maxillary teeth shifting made dentist suspect she may grind her teeth at night.  No recent URI sx's or fever.  No ear drainage.  Past Medical History:  Diagnosis Date  . GAD (generalized anxiety disorder)    zoloft in the past helped but sexual side effects caused her to d/c it.  . Gastritis Mar/Apr 2014  . GERD (gastroesophageal reflux disease)    chronic cough--eval by Dr. Shoemaker-ENT-07/22/13.  . Microscopic hematuria    Exam, labs, cysto, and imaging all UNREMARKABLE with Dr. Tresa Moore all NEG--pt to get further eval only if gross hematuria occurs.  . 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 CESAREAN SECTION N/A 07/06/2016   Procedure: CESAREAN SECTION;  Surgeon: Tyson Dense, MD;  Location: [place];  Service: Obstetrics;  Laterality: N/A;  . WISDOM TOOTH EXTRACTION  approx [id number]   No complications    Outpatient Medications Prior to Visit  Medication Sig Dispense Refill  . albuterol (VENTOLIN HFA) 108 (90 Base) MCG/ACT inhaler Inhale 1-2 puffs into the lungs every 4 (four) hours as needed for wheezing or shortness of breath. 1 Inhaler 5  . ibuprofen (ADVIL,MOTRIN) 600 MG tablet Take 1 tablet (600 mg total) by mouth every 6 (six) hours. 30 tablet 1  . Prenatal Vit-Fe Fumarate-FA (PRENATAL MULTIVITAMIN) TABS tablet Take 1 tablet by mouth daily at 12 noon.    Marland Kitchen oxyCODONE (OXY  IR/ROXICODONE) 5 MG immediate release tablet Take 1 tablet (5 mg total) by mouth every 4 (four) hours as needed (pain scale 4-7). (Patient not taking: Reported on 08/16/2016) 30 tablet 0   No facility-administered medications prior to visit.     Allergies  Allergen Reactions  . Sulfa Antibiotics     Pt states that both mother and father are allergic, she she thinks that she might be.  . Procardia [Nifedipine] Palpitations    ROS As per HPI  PE: Blood pressure 103/70, pulse 69, temperature 98.1 F (36.7 C), temperature source Oral, resp. rate 16, height 5' 2" (1.575 m), weight 162 lb (73.5 kg), SpO2 97 %, unknown if currently breastfeeding. Gen: Alert, well appearing.  Patient is oriented to person, place, time, and situation. AFFECT: pleasant, lucid thought and speech. ENT: Ears: EACs clear, normal epithelium.  TMs with good light reflex and landmarks bilaterally.  Eyes: no injection, icteris, swelling, or exudate.  EOMI, PERRLA. Nose: no drainage or turbinate edema/swelling.  No injection or focal lesion.  Mouth: lips without lesion/swelling.  Oral mucosa pink and moist.  Dentition intact and without obvious caries or gingival swelling.  Oropharynx without erythema, exudate, or swelling.  Right and left TMJ regions w/out tenderness.  She has opening click at left TMJ but no palpable subluxation. No tenderness to soft tissues of region just  anterior to the ear on either side.  No erythema, no swelling.  LABS:  none  IMPRESSION AND PLAN:  Right TMJ arthralgia.  With lack of tenderness or notable swelling today, I have low suspicion of actual TMJ arthritis, but I'll keep this in mind if she does not improve with wearing mouth guard in sleep and with isometric jaw exercises for TMJ arthralgia. She is breast feeding so she declined any anti-inflammatory meds.  An After Visit Summary was printed and given to the patient.  FOLLOW UP: Return if symptoms worsen or fail to improve in 1  month.  If no improvement in 1 mo, will do x-ray of R TMJ region as well as check ESR, CRP, CBC, BMET, Rh factor and CCP ab.  Signed:  Crissie Sickles, MD           08/16/2016

## 2016-08-16 NOTE — Patient Instructions (Signed)
Do isometric jaw exercises at least once per day.  Wear mouth guard/oral appliance when you sleep.

## 2016-09-11 MED FILL — NORETHINDRONE 0.35 MG TAB: 0.35 | 84 days supply | Qty: 84 | Fill #1

## 2016-10-04 NOTE — Addendum Note (Signed)
Addendum  created 10/04/16 1425 by Montez Hageman, MD   Sign clinical note

## 2016-10-04 NOTE — Anesthesia Postprocedure Evaluation (Signed)
Anesthesia Post Note  Patient: Heather Conley  Procedure(s) Performed: Procedure(s) (LRB): CESAREAN SECTION (N/A)     Anesthesia Post Evaluation  Last Vitals:  Vitals:   07/08/16 1807 07/09/16 0558  BP: (!) 112/56 114/60  Pulse: 77 64  Resp: 19 18  Temp: 36.4 C 36.9 C    Last Pain:  Vitals:   07/09/16 0910  TempSrc:   PainSc: 2                  Montez Hageman

## 2016-11-05 ENCOUNTER — Telehealth: Payer: Self-pay | Admitting: Family Medicine

## 2016-11-15 ENCOUNTER — Ambulatory Visit (INDEPENDENT_AMBULATORY_CARE_PROVIDER_SITE_OTHER): Payer: 59 | Admitting: Family Medicine

## 2016-11-15 ENCOUNTER — Encounter: Payer: Self-pay | Admitting: Family Medicine

## 2016-11-15 VITALS — BP 96/65 | HR 66 | Temp 98.0°F | Resp 16 | Ht 62.0 in | Wt 147.8 lb

## 2016-11-15 DIAGNOSIS — Z0000 Encounter for general adult medical examination without abnormal findings: Secondary | ICD-10-CM

## 2016-11-15 DIAGNOSIS — R41840 Attention and concentration deficit: Secondary | ICD-10-CM

## 2016-11-15 DIAGNOSIS — R51 Headache: Secondary | ICD-10-CM | POA: Diagnosis not present

## 2016-11-15 DIAGNOSIS — R5382 Chronic fatigue, unspecified: Secondary | ICD-10-CM

## 2016-11-15 DIAGNOSIS — R519 Headache, unspecified: Secondary | ICD-10-CM

## 2016-11-15 LAB — COMPREHENSIVE METABOLIC PANEL
ALK PHOS: 72 U/L (ref 39–117)
ALT: 11 U/L (ref 0–35)
AST: 12 U/L (ref 0–37)
Albumin: 4.7 g/dL (ref 3.5–5.2)
BUN: 21 mg/dL (ref 6–23)
CALCIUM: 9.6 mg/dL (ref 8.4–10.5)
CO2: 30 mEq/L (ref 19–32)
Chloride: 108 mEq/L (ref 96–112)
Creatinine, Ser: 0.74 mg/dL (ref 0.40–1.20)
GFR: 95.33 mL/min (ref >60.00)
GLUCOSE: 78 mg/dL (ref 70–99)
POTASSIUM: 4.3 meq/L (ref 3.5–5.1)
Sodium: 143 mEq/L (ref 135–145)
TOTAL PROTEIN: 7.1 g/dL (ref 6.0–8.3)
Total Bilirubin: 0.6 mg/dL (ref 0.2–1.2)

## 2016-11-15 LAB — CBC WITH DIFFERENTIAL/PLATELET
BASOS ABS: 0.1 10*3/uL (ref 0.0–0.1)
Basophils Relative: 1.2 % (ref 0.0–3.0)
EOS PCT: 2.9 % (ref 0.0–5.0)
Eosinophils Absolute: 0.2 10*3/uL (ref 0.0–0.7)
HCT: 45.3 % (ref 36.0–46.0)
HEMOGLOBIN: 14.4 g/dL (ref 12.0–15.0)
Lymphocytes Relative: 33.5 % (ref 12.0–46.0)
Lymphs Abs: 2.2 10*3/uL (ref 0.7–4.0)
MCHC: 31.7 g/dL (ref 30.0–36.0)
MCV: 87.8 fl (ref 78.0–100.0)
MONO ABS: 0.7 10*3/uL (ref 0.1–1.0)
MONOS PCT: 10 % (ref 3.0–12.0)
Neutro Abs: 3.5 10*3/uL (ref 1.4–7.7)
Neutrophils Relative %: 52.4 % (ref 43.0–77.0)
Platelets: 269 10*3/uL (ref 150.0–400.0)
RBC: 5.15 Mil/uL — AB (ref 3.87–5.11)
RDW: 14.2 % (ref 11.5–15.5)
WBC: 6.7 10*3/uL (ref 4.0–10.5)

## 2016-11-15 LAB — TSH: TSH: 1.73 u[IU]/mL (ref 0.35–4.50)

## 2016-11-15 LAB — LIPID PANEL
Cholesterol: 177 mg/dL (ref 0–200)
HDL: 60.5 mg/dL (ref >39.00)
LDL Cholesterol: 108 mg/dL — ABNORMAL HIGH (ref 0–99)
NONHDL: 116.3
TRIGLYCERIDES: 40 mg/dL (ref 0.0–149.0)
Total CHOL/HDL Ratio: 3
VLDL: 8 mg/dL (ref 0.0–40.0)

## 2016-11-15 LAB — VITAMIN B12: VITAMIN B 12: 284 pg/mL (ref 211–911)

## 2016-11-15 LAB — VITAMIN D 25 HYDROXY (VIT D DEFICIENCY, FRACTURES): VITD: 34.4 ng/mL (ref 30.00–100.00)

## 2016-11-15 NOTE — Progress Notes (Signed)
Office Note 11/15/2016  CC:  Chief Complaint  Patient presents with  . Annual Exam    Pt is fasting.     HPI:  Heather Conley is a 36 y.o. White female who is here for annual health maintenance exam. Her GYN MD is Dr. Marylynn Pearson. Most recent PAP: 06/2016 Currently breast feeding: yes + pumping when she is at work.  Eyes: annual exams. Dental: q6 - 12 mo Exercise:  Walking and some gym classes inconsistently. Diet: trying to eat lots of fresh veggies, fiber, lean meats.  Complains of going through periods of her memory not being quite as sharp as normal, poor concentration, some headaches. She is very busy, taking care of infant and breast feeding, and working again now.  Denies depression.  Chronic anxiety. Sleep is not restorative.  Chronic fatigue.   Past Medical History:  Diagnosis Date  . GAD (generalized anxiety disorder)    zoloft in the past helped but sexual side effects caused her to d/c it.  . Gastritis Mar/Apr 2014  . GERD (gastroesophageal reflux disease)    chronic cough--eval by Dr. Shoemaker-ENT-07/22/13.  . Microscopic hematuria    Exam, labs, cysto, and imaging all UNREMARKABLE with Dr. Tresa Moore all NEG--pt to get further eval only if gross hematuria occurs.  . 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 CESAREAN SECTION N/A 07/06/2016   Procedure: CESAREAN SECTION;  Surgeon: Tyson Dense, MD;  Location: [facility];  Service: Obstetrics;  Laterality: N/A;  . WISDOM TOOTH EXTRACTION  approx 2130   No complications    Family History  Problem Relation Age of Onset  . Alcohol abuse Mother   . Heart disease Maternal Uncle   . Heart disease Maternal Grandfather   . Stroke Paternal Grandmother     Social History   Social History  . Marital status: Married    Spouse name: N/A  . Number of children: N/A  . Years of education: N/A   Occupational History  . Not on file.    Social History Main Topics  . Smoking status: Never Smoker  . Smokeless tobacco: Never Used  . Alcohol use Yes     Comment: social  . Drug use: No  . Sexual activity: Yes   Other Topics Concern  . Not on file   Social History Narrative   Married, one daughter born 2018.  Lives in [place].   Orig from [place].     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  Currently nurse in Cath lab at [employer]   No tobacco, occas alcohol, no drugs.   Exercises 3-5 days per week.   Normal diet.          Outpatient Medications Prior to Visit  Medication Sig Dispense Refill  . albuterol (VENTOLIN HFA) 108 (90 Base) MCG/ACT inhaler Inhale 1-2 puffs into the lungs every 4 (four) hours as needed for wheezing or shortness of breath. 1 Inhaler 5  . Prenatal Vit-Fe Fumarate-FA (PRENATAL MULTIVITAMIN) TABS tablet Take 1 tablet by mouth daily at 12 noon.    Marland Kitchen ibuprofen (ADVIL,MOTRIN) 600 MG tablet Take 1 tablet (600 mg total) by mouth every 6 (six) hours. (Patient not taking: Reported on 11/15/2016) 30 tablet 1   No facility-administered medications prior to visit.     Allergies  Allergen Reactions  . Sulfa Antibiotics     Pt states that both mother and father are allergic, she she  thinks that she might be.  . Procardia [Nifedipine] Palpitations    ROS Review of Systems  Constitutional: Positive for fatigue. Negative for appetite change, chills and fever.  HENT: Negative for congestion, dental problem, ear pain and sore throat.   Eyes: Negative for discharge, redness and visual disturbance.  Respiratory: Negative for cough, chest tightness, shortness of breath and wheezing.   Cardiovascular: Negative for chest pain, palpitations and leg swelling.  Gastrointestinal: Negative for abdominal pain, blood in stool, diarrhea, nausea and vomiting.  Genitourinary: Negative for difficulty urinating, dysuria, flank pain, frequency, hematuria and urgency.  Musculoskeletal: Negative for arthralgias, back pain, joint  swelling, myalgias and neck stiffness.  Skin: Negative for pallor and rash.  Neurological: Positive for headaches. Negative for dizziness, speech difficulty and weakness.  Hematological: Negative for adenopathy. Does not bruise/bleed easily.  Psychiatric/Behavioral: Positive for decreased concentration and sleep disturbance. Negative for confusion, dysphoric mood and suicidal ideas. The patient is nervous/anxious.     PE; Blood pressure 96/65, pulse 66, temperature 98 F (36.7 C), temperature source Oral, resp. rate 16, height 5\' 2"  (1.575 m), weight 147 lb 12 oz (67 kg), SpO2 94 %, unknown if currently breastfeeding. Pt examined with Sharen Hones, CMA, as chaperone. Gen: Alert, well appearing.  Patient is oriented to person, place, time, and situation. AFFECT: pleasant, lucid thought and speech. ENT: Ears: EACs clear, normal epithelium.  TMs with good light reflex and landmarks bilaterally.  Eyes: no injection, icteris, swelling, or exudate.  EOMI, PERRLA. Nose: no drainage or turbinate edema/swelling.  No injection or focal lesion.  Mouth: lips without lesion/swelling.  Oral mucosa pink and moist.  Dentition intact and without obvious caries or gingival swelling.  Oropharynx without erythema, exudate, or swelling.  Neck: supple/nontender.  No LAD, mass, or TM.  Carotid pulses 2+ bilaterally, without bruits. CV: RRR, no m/r/g.   LUNGS: CTA bilat, nonlabored resps, good aeration in all lung fields. ABD: soft, NT, ND, BS normal.  No hepatospenomegaly or mass.  No bruits. EXT: no clubbing, cyanosis, or edema.  Musculoskeletal: no joint swelling, erythema, warmth, or tenderness.  ROM of all joints intact. Skin - no sores or suspicious lesions or rashes or color changes   Pertinent labs:  Lab Results  Component Value Date   TSH 2.91 05/13/2015   Lab Results  Component Value Date   WBC 17.7 (H) 07/07/2016   HGB 12.0 07/07/2016   HCT 36.3 07/07/2016   MCV 84.6 07/07/2016   PLT 267  07/07/2016   Lab Results  Component Value Date   CREATININE 0.70 05/13/2015   BUN 16 05/13/2015   NA 140 05/13/2015   K 4.2 05/13/2015   CL 107 05/13/2015   CO2 29 05/13/2015   Lab Results  Component Value Date   ALT 9 05/13/2015   AST 13 05/13/2015   ALKPHOS 34 (L) 05/13/2015   BILITOT 0.5 05/13/2015   Lab Results  Component Value Date   CHOL 179 05/13/2015   Lab Results  Component Value Date   HDL 67.50 05/13/2015   Lab Results  Component Value Date   LDLCALC 89 05/13/2015   Lab Results  Component Value Date   TRIG 113.0 05/13/2015   Lab Results  Component Value Date   CHOLHDL 3 05/13/2015    ASSESSMENT AND PLAN:   Health maintenance exam: Reviewed age and gender appropriate health maintenance issues (prudent diet, regular exercise, health risks of tobacco and excessive alcohol, use of seatbelts, fire alarms in home, use of sunscreen).  Also reviewed age and gender appropriate health screening as well as vaccine recommendations. Vaccines: UTD Labs: fasting HP labs.  HIV screening UTD (done via GYN as prenatal care w/u).  Will get vit D and vit B12 as well--chron fatigue/HA's/concentration probs. Suspect these sx's are a result of stressful/hectic lifestyle, esp since the birth of her little girl + returning to work recently. Cervical ca screening: as per GYN MD--UTD.  An After Visit Summary was printed and given to the patient.  FOLLOW UP:  Return in about 1 year (around 11/15/2017) for annual CPE (fasting).  Signed:  Crissie Sickles, MD           11/15/2016

## 2016-11-15 NOTE — Patient Instructions (Signed)

## 2016-11-16 ENCOUNTER — Encounter: Payer: Self-pay | Admitting: *Deleted

## 2016-11-20 NOTE — Telephone Encounter (Signed)
error 

## 2016-11-30 ENCOUNTER — Ambulatory Visit (INDEPENDENT_AMBULATORY_CARE_PROVIDER_SITE_OTHER): Payer: 59 | Admitting: Family Medicine

## 2016-11-30 ENCOUNTER — Encounter: Payer: Self-pay | Admitting: Family Medicine

## 2016-11-30 ENCOUNTER — Ambulatory Visit: Payer: 59 | Admitting: Physician Assistant

## 2016-11-30 VITALS — BP 109/62 | HR 88 | Temp 98.2°F | Resp 16 | Ht 62.0 in | Wt 148.2 lb

## 2016-11-30 DIAGNOSIS — M2669 Other specified disorders of temporomandibular joint: Secondary | ICD-10-CM | POA: Diagnosis not present

## 2016-11-30 DIAGNOSIS — K219 Gastro-esophageal reflux disease without esophagitis: Secondary | ICD-10-CM

## 2016-11-30 DIAGNOSIS — M26649 Arthritis of unspecified temporomandibular joint: Secondary | ICD-10-CM

## 2016-11-30 DIAGNOSIS — K2900 Acute gastritis without bleeding: Secondary | ICD-10-CM | POA: Diagnosis not present

## 2016-11-30 DIAGNOSIS — R109 Unspecified abdominal pain: Secondary | ICD-10-CM

## 2016-11-30 LAB — POCT URINE PREGNANCY: Preg Test, Ur: NEGATIVE

## 2016-11-30 MED ORDER — OMEPRAZOLE 40 MG PO CPDR: 40.0000 mg | DELAYED_RELEASE_CAPSULE | Freq: Every day | ORAL | 1 refills | Status: DC

## 2016-11-30 MED ORDER — CIMETIDINE 400 MG PO TABS: 400.0000 mg | ORAL_TABLET | Freq: Two times a day (BID) | ORAL | 1 refills | Status: DC

## 2016-11-30 NOTE — Progress Notes (Signed)
OFFICE VISIT  11/30/2016   CC:  Chief Complaint  Patient presents with  . Abdominal Pain    Upper   HPI:    Patient is a 36 y.o. Caucasian female who presents for "stomach issues".  Over the last 10-14d. Still having R TMJ region soreness and intermittent swelling. Also having dull HAs--frontal/temporal and dull, foggy thinking at times with this. Then she started having sub-xyphoid burning with lots of burping.  Not right after eating.  Similar to past sx's when we treated her for gastritis.  Nausea comes in waves when her abd hurts.  No vomiting.  No diarrhea or constipation.  No melena or hematochezia.  + classic GERD sx's along with all this. Sore in bottoms of feet and low back, mild, +stiffness.  NO fevers.  No rashes.  No urinary or respiratory complaints. Back and feet are most common regions of soreness.  Once she gets up and moves she feels better/less stiff. Hands are fine.  No redness or swelling in feet.  No oral ulcers.  No otc or rx meds tried for her sx's.  She is currently breast feeding.  She did have a stressful time getting back to work and having to leave her baby at home.  Has been working 3 twelve hour shifts per week.  Not going to the gym lately. UPT at home neg about 3 wks ago.  She is on micronor and hasn't missed any doses.  Since giving birth 06/2016 she has not had return of menses.    Past Medical History:  Diagnosis Date  . GAD (generalized anxiety disorder)    zoloft in the past helped but sexual side effects caused her to d/c it.  . Gastritis Mar/Apr 2014  . GERD (gastroesophageal reflux disease)    chronic cough--eval by Dr. Shoemaker-ENT-07/22/13.  . Microscopic hematuria    Exam, labs, cysto, and imaging all UNREMARKABLE with Dr. Tresa Moore all NEG--pt to get further eval only if gross hematuria occurs.  . 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 CESAREAN SECTION N/A 07/06/2016   Procedure: CESAREAN SECTION;  Surgeon: Tyson Dense, MD;  Location: [facility];  Service: Obstetrics;  Laterality: N/A;  . WISDOM TOOTH EXTRACTION  approx 5397   No complications    Outpatient Medications Prior to Visit  Medication Sig Dispense Refill  . albuterol (VENTOLIN HFA) 108 (90 Base) MCG/ACT inhaler Inhale 1-2 puffs into the lungs every 4 (four) hours as needed for wheezing or shortness of breath. 1 Inhaler 5  . norethindrone (MICRONOR,CAMILA,ERRIN) 0.35 MG tablet Take 1 tablet by mouth daily.  12  . Prenatal Vit-Fe Fumarate-FA (PRENATAL MULTIVITAMIN) TABS tablet Take 1 tablet by mouth daily at 12 noon.     No facility-administered medications prior to visit.     Allergies  Allergen Reactions  . Sulfa Antibiotics     Pt states that both mother and father are allergic, she she thinks that she might be.  . Procardia [Nifedipine] Palpitations    ROS As per HPI  PE: Blood pressure 109/62, pulse 88, temperature 98.2 F (36.8 C), temperature source Oral, resp. rate 16, height 5\' 2"  (1.575 m), weight 148 lb 4 oz (67.2 kg), SpO2 96 %, unknown if currently breastfeeding. Gen: Alert, well appearing.  Patient is oriented to person, place, time, and situation. AFFECT: pleasant, lucid thought and speech. ENT: Ears: EACs clear, normal epithelium.  TMs with good light  reflex and landmarks bilaterally.  R TMJ tender but no erythema or swelling.  No signif sign of subluxation of R TMJ.  Left TMJ normal.  Eyes: no injection, icteris, swelling, or exudate.  EOMI, PERRLA. Nose: no drainage or turbinate edema/swelling.  No injection or focal lesion.  Mouth: lips without lesion/swelling.  Oral mucosa pink and moist.  Dentition intact and without obvious caries or gingival swelling.  Oropharynx without erythema, exudate, or swelling.  Neck - No masses or thyromegaly or limitation in range of motion CV: RRR, no m/r/g.   LUNGS: CTA bilat, nonlabored resps, good aeration in all lung  fields. ABD: mild TTP from midline sub-xyphoid area about 1/2 way down to umbilicus, otherwise NO TENDERNESS. No distention, BS normal.  No HSM, mass, or bruit. EXT: no clubbing, cyanosis, or edema.  Skin - no sores or suspicious lesions or rashes or color changes Musculoskeletal: no joint swelling, erythema (except R TMJ), warmth, or tenderness.  ROM of all joints intact.   LABS:    Chemistry      Component Value Date/Time   NA 143 11/15/2016 1024   K 4.3 11/15/2016 1024   CL 108 11/15/2016 1024   CO2 30 11/15/2016 1024   BUN 21 11/15/2016 1024   CREATININE 0.74 11/15/2016 1024   CREATININE 0.69 07/15/2012 1630      Component Value Date/Time   CALCIUM 9.6 11/15/2016 1024   ALKPHOS 72 11/15/2016 1024   AST 12 11/15/2016 1024   ALT 11 11/15/2016 1024   BILITOT 0.6 11/15/2016 1024     Lab Results  Component Value Date   WBC 6.7 11/15/2016   HGB 14.4 11/15/2016   HCT 45.3 11/15/2016   MCV 87.8 11/15/2016   PLT 269.0 11/15/2016   Lab Results  Component Value Date   TSH 1.73 11/15/2016   UPT today: negative  IMPRESSION AND PLAN:  1) Dyspepsia vs gastritis, with GERD. Stress definiteley contributing. In review of options for med treatment in this pt who is breastfeeding, I chose omeprazole 40mg  qAM, as my references noted that the avg concentration in infants BF when their mothers took this med chronically was LESS than the therapeutic concentration when babies take this med. Pt felt comfortable with this plan.   Encouraged her to get back into habit of exercising. Also, she is weighing her options regarding job change or cutting back on hours or not working at all, so that she is less stressed.  2) Right TMJ pain; pt describes arthritis. She has still not obtained a mouth guard to wear hs.  She will do this ASAP, and if not improved TMJ pain in the next 2-3 weeks while wearing mouth guard, then will do basic w/u for RA, since TMJ arthritis can be a presenting finding in  this disease.  Spent 25 min with pt today, with >50% of this time spent in counseling and care coordination regarding the above problems.  An After Visit Summary was printed and given to the patient.  FOLLOW UP: Return if symptoms worsen or fail to improve.  Signed:  Crissie Sickles, MD           11/30/2016

## 2016-12-10 MED FILL — NORETHINDRONE 0.35 MG TAB: 0.35 | 84 days supply | Qty: 84 | Fill #2

## 2017-01-24 ENCOUNTER — Telehealth: Payer: Self-pay | Admitting: Family Medicine

## 2017-01-24 NOTE — Telephone Encounter (Signed)
Yes, ok with me 

## 2017-01-24 NOTE — Telephone Encounter (Signed)
Patient requesting to transfer care from Dr. Anitra Lauth to [facility], [place].  States [facility] office is more convenient for her.

## 2017-01-24 NOTE — Telephone Encounter (Signed)
Ok with me 

## 2017-01-25 NOTE — Telephone Encounter (Signed)
Left voice mail message notifying patient.

## 2017-01-30 ENCOUNTER — Ambulatory Visit (INDEPENDENT_AMBULATORY_CARE_PROVIDER_SITE_OTHER): Payer: 59 | Admitting: Physician Assistant

## 2017-01-30 ENCOUNTER — Encounter: Payer: Self-pay | Admitting: Physician Assistant

## 2017-01-30 ENCOUNTER — Other Ambulatory Visit: Payer: Self-pay

## 2017-01-30 VITALS — BP 108/70 | HR 57 | Temp 98.5°F | Resp 14 | Ht 62.0 in | Wt 144.0 lb

## 2017-01-30 DIAGNOSIS — B354 Tinea corporis: Secondary | ICD-10-CM

## 2017-01-30 DIAGNOSIS — R55 Syncope and collapse: Secondary | ICD-10-CM

## 2017-01-30 DIAGNOSIS — K219 Gastro-esophageal reflux disease without esophagitis: Secondary | ICD-10-CM | POA: Diagnosis not present

## 2017-01-30 DIAGNOSIS — R42 Dizziness and giddiness: Secondary | ICD-10-CM | POA: Diagnosis not present

## 2017-01-30 LAB — CBC WITH DIFFERENTIAL/PLATELET
BASOS ABS: 0 10*3/uL (ref 0.0–0.1)
Basophils Relative: 0.7 % (ref 0.0–3.0)
EOS ABS: 0.2 10*3/uL (ref 0.0–0.7)
Eosinophils Relative: 2.4 % (ref 0.0–5.0)
HEMATOCRIT: 44.8 % (ref 36.0–46.0)
HEMOGLOBIN: 14.5 g/dL (ref 12.0–15.0)
LYMPHS PCT: 40.8 % (ref 12.0–46.0)
Lymphs Abs: 2.9 10*3/uL (ref 0.7–4.0)
MCHC: 32.3 g/dL (ref 30.0–36.0)
MCV: 86.6 fl (ref 78.0–100.0)
MONO ABS: 0.6 10*3/uL (ref 0.1–1.0)
Monocytes Relative: 8.7 % (ref 3.0–12.0)
Neutro Abs: 3.3 10*3/uL (ref 1.4–7.7)
Neutrophils Relative %: 47.4 % (ref 43.0–77.0)
Platelets: 314 10*3/uL (ref 150.0–400.0)
RBC: 5.17 Mil/uL — AB (ref 3.87–5.11)
RDW: 14.1 % (ref 11.5–15.5)
WBC: 7.1 10*3/uL (ref 4.0–10.5)

## 2017-01-30 LAB — COMPREHENSIVE METABOLIC PANEL
ALBUMIN: 4.9 g/dL (ref 3.5–5.2)
ALT: 10 U/L (ref 0–35)
AST: 11 U/L (ref 0–37)
Alkaline Phosphatase: 79 U/L (ref 39–117)
BILIRUBIN TOTAL: 0.7 mg/dL (ref 0.2–1.2)
BUN: 16 mg/dL (ref 6–23)
CALCIUM: 9.8 mg/dL (ref 8.4–10.5)
CO2: 29 mEq/L (ref 19–32)
Chloride: 105 mEq/L (ref 96–112)
Creatinine, Ser: 0.69 mg/dL (ref 0.40–1.20)
GFR: 103.22 mL/min (ref >60.00)
Glucose, Bld: 81 mg/dL (ref 70–99)
Potassium: 4.3 mEq/L (ref 3.5–5.1)
SODIUM: 142 meq/L (ref 135–145)
TOTAL PROTEIN: 7.4 g/dL (ref 6.0–8.3)

## 2017-01-30 LAB — SEDIMENTATION RATE: SED RATE: 4 mm/h (ref 0–20)

## 2017-01-30 MED ORDER — KETOCONAZOLE 2 % EX CREA: 1.0000 "application " | TOPICAL_CREAM | Freq: Every day | CUTANEOUS | 0 refills | Status: DC

## 2017-01-30 MED FILL — KETOCONAZOLE 2% CREAM: 2 | 15 days supply | Qty: 15 | Fill #0

## 2017-01-30 MED FILL — OMEPRAZOLE DR 40 MG CAPSULE: 40 | 30 days supply | Qty: 30 | Fill #0

## 2017-01-30 NOTE — Progress Notes (Signed)
Pre visit review using our clinic review tool, if applicable. No additional management support is needed unless otherwise documented below in the visit note. 

## 2017-01-30 NOTE — Patient Instructions (Addendum)
Please start the cream to help with yeast rash since we want to avoid oral medications. Let me know if things are not improving.  Giving the frequency of these episodes I would like to consider setting you up with a specialist. I am checking som different labs today to further assess symptoms. I will call you with your results. Increase fluids. Consider adding on a small G2 gatorade daily.   Start he omeprazole for 2 week trial to calm down this reflux.  Start a daily probiotic.  Follow-up with me in 2 weeks.

## 2017-02-01 NOTE — Progress Notes (Signed)
Patient presents to clinic today to transfer care from our [place] [facility].  Acute Concerns: Patient endorses episodes of lightheadedness occurring over the past 4 weeks or so. Describes episodes occurring with positional changes. Do not occur while seated. Denies dizziness/vertigo. Denies palpitations, chest pain or SOB during episodes. Denies changes in diet. States she could hydrate better. Has history of vasovagal episodes in the past that were similar but only when seeing a lot of blood. Notes some increased stressors and has some generalized anxiety due to stressors. Denies depressed mood or anhedonia. Denies SI/HI. States she is very physically active and does not have symptoms with aerobic exercise.  Denies known history of hypoglycemia.  Chronic Issues: GERD -- Is prescribed a regimen of omeprazole but has not been taking. Endorses heart burn with reflux. Denies nausea/vomiting or epigastric pain. No change to bowel habits. Denies melena, hematochezia or tenesmus.  Health Maintenance: Immunizations -- up-to-date. PAP -- up-to-date  Past Medical History:  Diagnosis Date  . GAD (generalized anxiety disorder)    zoloft in the past helped but sexual side effects caused her to d/c it.  . Gastritis Mar/Apr 2014  . GERD (gastroesophageal reflux disease)    chronic cough--eval by Dr. Shoemaker-ENT-07/22/13.  . Microscopic hematuria    Exam, labs, cysto, and imaging all UNREMARKABLE with Dr. Tresa Moore all NEG--pt to get further eval only if gross hematuria occurs.  . 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 WISDOM TOOTH EXTRACTION  approx 5686   No complications    Current Outpatient Medications on File Prior to Visit  Medication Sig Dispense Refill  . norethindrone (MICRONOR,CAMILA,ERRIN) 0.35 MG tablet Take 1 tablet by mouth daily.  12  . Prenatal Vit-Fe Fumarate-FA (PRENATAL MULTIVITAMIN) TABS tablet Take 1 tablet by  mouth daily at 12 noon.    Marland Kitchen albuterol (VENTOLIN HFA) 108 (90 Base) MCG/ACT inhaler Inhale 1-2 puffs into the lungs every 4 (four) hours as needed for wheezing or shortness of breath. (Patient not taking: Reported on 01/30/2017) 1 Inhaler 5  . omeprazole (PRILOSEC) 40 MG capsule Take 1 capsule (40 mg total) by mouth daily. (Patient not taking: Reported on 01/30/2017) 30 capsule 1   No current facility-administered medications on file prior to visit.     Allergies  Allergen Reactions  . Sulfa Antibiotics     Pt states that both mother and father are allergic, she she thinks that she might be.  . Procardia [Nifedipine] Palpitations    Family History  Problem Relation Age of Onset  . Alcohol abuse Mother   . Heart disease Maternal Uncle 70  . Heart disease Maternal Grandfather   . Stroke Paternal Grandmother     Social History   Socioeconomic History  . Marital status: Married    Spouse name: Not on file  . Number of children: Not on file  . Years of education: Not on file  . Highest education level: Not on file  Social Needs  . Financial resource strain: Not on file  . Food insecurity - worry: Not on file  . Food insecurity - inability: Not on file  . Transportation needs - medical: Not on file  . Transportation needs - non-medical: Not on file  Occupational History  . Occupation: Nurse    Employer: [employer]    Comment: Cath Lab  Tobacco Use  . Smoking status: Never Smoker  . Smokeless tobacco: Never Used  Substance and Sexual Activity  . Alcohol use: Yes    Comment: social  . Drug use: No  . Sexual activity: Yes  Other Topics Concern  . Not on file  Social History Narrative   Married, one daughter born 2018.  Lives in [place].   Orig from [place].     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   No tobacco, occas alcohol, no drugs.   Exercises 3-5 days per week.   Normal diet.      Review of Systems  Constitutional: Positive for  malaise/fatigue. Negative for fever.  HENT: Negative for hearing loss and tinnitus.   Eyes: Negative for blurred vision and double vision.  Respiratory: Negative for cough and shortness of breath.   Cardiovascular: Negative for chest pain and palpitations.  Gastrointestinal: Positive for heartburn. Negative for abdominal pain, blood in stool, constipation, diarrhea, melena, nausea and vomiting.  Genitourinary: Negative for dysuria, flank pain, frequency, hematuria and urgency.  Neurological: Negative for tingling, loss of consciousness and headaches.       + lightheadedness -- episodic  Psychiatric/Behavioral: Negative for depression, hallucinations, substance abuse and suicidal ideas. The patient is not nervous/anxious and does not have insomnia.    BP 108/70   Pulse (!) 57   Temp 98.5 F (36.9 C) (Oral)   Resp 14   Ht _0  (1.575 m)   Wt 144 lb (65.3 kg)   SpO2 99%   Breastfeeding? Yes   BMI 26.34 kg/m   Physical Exam  Constitutional: She is oriented to person, place, and time and well-developed, well-nourished, and in no distress.  HENT:  Head: Normocephalic and atraumatic.  Right Ear: External ear normal.  Left Ear: External ear normal.  Nose: Nose normal.  Mouth/Throat: Oropharynx is clear and moist. No oropharyngeal exudate.  TM within normal limits bilaterally.  Eyes: Conjunctivae and EOM are normal. Pupils are equal, round, and reactive to light.  Neck: Neck supple. No thyromegaly present.  Cardiovascular: Normal rate, regular rhythm, normal heart sounds and intact distal pulses.  Pulmonary/Chest: Effort normal and breath sounds normal. No respiratory distress. She has no wheezes. She has no rales. She exhibits no tenderness.  Abdominal: Soft. Bowel sounds are normal. She exhibits no distension. There is no tenderness.  Lymphadenopathy:    She has no cervical adenopathy.  Neurological: She is alert and oriented to person, place, and time.  Skin: Skin is warm and dry.  No rash noted.  Psychiatric: Affect normal.  Vitals reviewed.   Recent Results (from the past 2160 hour(s))  CBC with Differential/Platelet     Status: Abnormal   Collection Time: 11/15/16 10:24 AM  Result Value Ref Range   WBC 6.7 4.0 - 10.5 K/uL   RBC 5.15 (H) 3.87 - 5.11 Mil/uL   Hemoglobin 14.4 12.0 - 15.0 g/dL   HCT 45.3 36.0 - 46.0 %   MCV 87.8 78.0 - 100.0 fl   MCHC 31.7 30.0 - 36.0 g/dL   RDW 14.2 11.5 - 15.5 %   Platelets 269.0 150.0 - 400.0 K/uL   Neutrophils Relative % 52.4 43.0 - 77.0 %   Lymphocytes Relative 33.5 12.0 - 46.0 %   Monocytes Relative 10.0 3.0 - 12.0 %   Eosinophils Relative 2.9 0.0 - 5.0 %   Basophils Relative 1.2 0.0 - 3.0 %   Neutro Abs 3.5 1.4 - 7.7 K/uL   Lymphs Abs 2.2 0.7 - 4.0 K/uL   Monocytes Absolute 0.7 0.1 -  1.0 K/uL   Eosinophils Absolute 0.2 0.0 - 0.7 K/uL   Basophils Absolute 0.1 0.0 - 0.1 K/uL  TSH     Status: None   Collection Time: 11/15/16 10:24 AM  Result Value Ref Range   TSH 1.73 0.35 - 4.50 uIU/mL  Lipid panel     Status: Abnormal   Collection Time: 11/15/16 10:24 AM  Result Value Ref Range   Cholesterol 177 0 - 200 mg/dL    Comment: ATP III Classification       Desirable:  < 200 mg/dL               Borderline High:  200 - 239 mg/dL          High:  > = 240 mg/dL   Triglycerides 40.0 0.0 - 149.0 mg/dL    Comment: Normal:  <150 mg/dLBorderline High:  150 - 199 mg/dL   HDL 60.50 >39.00 mg/dL   VLDL 8.0 0.0 - 40.0 mg/dL   LDL Cholesterol 108 (H) 0 - 99 mg/dL   Total CHOL/HDL Ratio 3     Comment:                Men          Women1/2 Average Risk     3.4          3.3Average Risk          5.0          4.42X Average Risk          9.6          7.13X Average Risk          15.0          11.0                       NonHDL 116.30     Comment: NOTE:  Non-HDL goal should be 30 mg/dL higher than patient's LDL goal (i.e. LDL goal of < 70 mg/dL, would have non-HDL goal of < 100 mg/dL)  Comprehensive metabolic panel     Status: None    Collection Time: 11/15/16 10:24 AM  Result Value Ref Range   Sodium 143 135 - 145 mEq/L   Potassium 4.3 3.5 - 5.1 mEq/L   Chloride 108 96 - 112 mEq/L   CO2 30 19 - 32 mEq/L   Glucose, Bld 78 70 - 99 mg/dL   BUN 21 6 - 23 mg/dL   Creatinine, Ser 0.74 0.40 - 1.20 mg/dL   Total Bilirubin 0.6 0.2 - 1.2 mg/dL   Alkaline Phosphatase 72 39 - 117 U/L   AST 12 0 - 37 U/L   ALT 11 0 - 35 U/L   Total Protein 7.1 6.0 - 8.3 g/dL   Albumin 4.7 3.5 - 5.2 g/dL   Calcium 9.6 8.4 - 10.5 mg/dL   GFR 95.33 >60.00 mL/min  VITAMIN D 25 Hydroxy (Vit-D Deficiency, Fractures)     Status: None   Collection Time: 11/15/16 10:24 AM  Result Value Ref Range   VITD 34.40 30.00 - 100.00 ng/mL  Vitamin B12     Status: None   Collection Time: 11/15/16 10:24 AM  Result Value Ref Range   Vitamin B-12 284 211 - 911 pg/mL  POCT urine pregnancy     Status: None   Collection Time: 11/30/16  2:12 PM  Result Value Ref Range   Preg Test, Ur Negative Negative  CBC w/Diff  Status: Abnormal   Collection Time: 01/30/17 11:13 AM  Result Value Ref Range   WBC 7.1 4.0 - 10.5 K/uL   RBC 5.17 (H) 3.87 - 5.11 Mil/uL   Hemoglobin 14.5 12.0 - 15.0 g/dL   HCT 44.8 36.0 - 46.0 %   MCV 86.6 78.0 - 100.0 fl   MCHC 32.3 30.0 - 36.0 g/dL   RDW 14.1 11.5 - 15.5 %   Platelets 314.0 150.0 - 400.0 K/uL   Neutrophils Relative % 47.4 43.0 - 77.0 %   Lymphocytes Relative 40.8 12.0 - 46.0 %   Monocytes Relative 8.7 3.0 - 12.0 %   Eosinophils Relative 2.4 0.0 - 5.0 %   Basophils Relative 0.7 0.0 - 3.0 %   Neutro Abs 3.3 1.4 - 7.7 K/uL   Lymphs Abs 2.9 0.7 - 4.0 K/uL   Monocytes Absolute 0.6 0.1 - 1.0 K/uL   Eosinophils Absolute 0.2 0.0 - 0.7 K/uL   Basophils Absolute 0.0 0.0 - 0.1 K/uL  Comp Met (CMET)     Status: None   Collection Time: 01/30/17 11:13 AM  Result Value Ref Range   Sodium 142 135 - 145 mEq/L   Potassium 4.3 3.5 - 5.1 mEq/L   Chloride 105 96 - 112 mEq/L   CO2 29 19 - 32 mEq/L   Glucose, Bld 81 70 - 99 mg/dL    BUN 16 6 - 23 mg/dL   Creatinine, Ser 0.69 0.40 - 1.20 mg/dL   Total Bilirubin 0.7 0.2 - 1.2 mg/dL   Alkaline Phosphatase 79 39 - 117 U/L   AST 11 0 - 37 U/L   ALT 10 0 - 35 U/L   Total Protein 7.4 6.0 - 8.3 g/dL   Albumin 4.9 3.5 - 5.2 g/dL   Calcium 9.8 8.4 - 10.5 mg/dL   GFR 103.22 >60.00 mL/min  Sedimentation rate     Status: None   Collection Time: 01/30/17 11:13 AM  Result Value Ref Range   Sed Rate 4 0 - 20 mm/hr    Assessment/Plan: 1. Vasovagal episode Most likely cause of symptoms. Hydration and stress as potential triggers. Is running/exercising without symptoms. + prior history of vasovagal syncope but only with seeing blood during phlebotomy. EKG reveals sinus bradycardia. Patient is athletic. Will check labs today to further assess. Will refer to specialist if results unremarkable. She is to start increasing fluids and adding on a G2 gatorade. ER precautions reviewed. - EKG 12-Lead - CBC w/Diff - Comp Met (CMET) - Sedimentation rate  2. Tinea corporis Start Ketoconazole cream as directed. - ketoconazole (NIZORAL) 2 % cream; Apply 1 application daily topically.  Dispense: 15 g; Refill: 0  3. Gastroesophageal reflux disease without esophagitis Start PPI trial. GERD diet reviewed.  Follow-up 2 weeks.   Leeanne Rio, PA-C

## 2017-02-12 ENCOUNTER — Ambulatory Visit: Payer: 59 | Admitting: Physician Assistant

## 2017-02-18 DIAGNOSIS — L821 Other seborrheic keratosis: Secondary | ICD-10-CM | POA: Diagnosis not present

## 2017-02-18 DIAGNOSIS — Z23 Encounter for immunization: Secondary | ICD-10-CM | POA: Diagnosis not present

## 2017-02-18 DIAGNOSIS — D229 Melanocytic nevi, unspecified: Secondary | ICD-10-CM | POA: Diagnosis not present

## 2017-02-18 DIAGNOSIS — L820 Inflamed seborrheic keratosis: Secondary | ICD-10-CM | POA: Diagnosis not present

## 2017-02-18 DIAGNOSIS — L309 Dermatitis, unspecified: Secondary | ICD-10-CM | POA: Diagnosis not present

## 2017-02-20 ENCOUNTER — Ambulatory Visit: Payer: 59 | Admitting: Physician Assistant

## 2017-02-22 ENCOUNTER — Ambulatory Visit (INDEPENDENT_AMBULATORY_CARE_PROVIDER_SITE_OTHER): Payer: 59 | Admitting: Physician Assistant

## 2017-02-22 ENCOUNTER — Encounter: Payer: Self-pay | Admitting: Physician Assistant

## 2017-02-22 ENCOUNTER — Other Ambulatory Visit: Payer: Self-pay

## 2017-02-22 VITALS — BP 90/60 | HR 84 | Temp 98.0°F | Resp 14 | Ht 62.0 in | Wt 140.0 lb

## 2017-02-22 DIAGNOSIS — K219 Gastro-esophageal reflux disease without esophagitis: Secondary | ICD-10-CM

## 2017-02-22 DIAGNOSIS — R55 Syncope and collapse: Secondary | ICD-10-CM

## 2017-02-22 LAB — H. PYLORI ANTIBODY, IGG: H PYLORI IGG: NEGATIVE

## 2017-02-22 NOTE — Patient Instructions (Signed)
Please be consistent with hydration and diet. Continue the Gatorade or other electrolyte water. Take the Prilosec daily x 2 weeks. Continue the probiotic as directed.  We will alter regimen based on results.

## 2017-02-22 NOTE — Progress Notes (Signed)
Pre visit review using our clinic review tool, if applicable. No additional management support is needed unless otherwise documented below in the visit note. 

## 2017-02-22 NOTE — Progress Notes (Signed)
Patient presents to clinic today for follow-up of vasovagal episodes and GERD.   In regards to reflux symptoms, patient endorses that she has not been adherent with the Prilosec. Starting taking daily yesterday. Still having heart burn, reflux. Denies nausea/vomiting.   In regards to the vasovagal episodes, since increasing fluids and starting an electrolyte water, patient has not had recurrent episodes. Is trying to be more consistent with meals.  Past Medical History:  Diagnosis Date  . GAD (generalized anxiety disorder)    zoloft in the past helped but sexual side effects caused her to d/c it.  . Gastritis Mar/Apr 2014  . GERD (gastroesophageal reflux disease)    chronic cough--eval by Dr. Shoemaker-ENT-07/22/13.  . Microscopic hematuria    Exam, labs, cysto, and imaging all UNREMARKABLE with Dr. Tresa Moore all NEG--pt to get further eval only if gross hematuria occurs.  . 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 albuterol (VENTOLIN HFA) 108 (90 Base) MCG/ACT inhaler Inhale 1-2 puffs into the lungs every 4 (four) hours as needed for wheezing or shortness of breath. 1 Inhaler 5  . norethindrone (MICRONOR,CAMILA,ERRIN) 0.35 MG tablet Take 1 tablet by mouth daily.  12  . omeprazole (PRILOSEC) 40 MG capsule Take 1 capsule (40 mg total) by mouth daily. 30 capsule 1  . Prenatal Vit-Fe Fumarate-FA (PRENATAL MULTIVITAMIN) TABS tablet Take 1 tablet by mouth daily at 12 noon.    . triamcinolone cream (KENALOG) 0.1 % APPLY TO AFFECTED AREA ONCE A DAY EXTERNALLY FOR 15 DAYS  0   No current facility-administered medications on file prior to visit.     Allergies  Allergen Reactions  . Sulfa Antibiotics     Pt states that both mother and father are allergic, she she thinks that she might be.  . Procardia [Nifedipine] Palpitations    Family History  Problem Relation Age of Onset  . Alcohol abuse  Mother   . Heart disease Maternal Uncle 70  . Heart disease Maternal Grandfather   . Stroke Paternal Grandmother     Social History   Socioeconomic History  . Marital status: Married    Spouse name: None  . Number of children: None  . Years of education: None  . Highest education level: None  Social Needs  . Financial resource strain: None  . Food insecurity - worry: None  . Food insecurity - inability: None  . Transportation needs - medical: None  . Transportation needs - non-medical: None  Occupational History  . Occupation: Nurse    Employer: [employer]    Comment: Cath Lab  Tobacco Use  . Smoking status: Never Smoker  . Smokeless tobacco: Never Used  Substance and Sexual Activity  . Alcohol use: Yes    Comment: social  . Drug use: No  . Sexual activity: Yes  Other Topics Concern  . None  Social History Narrative   Married, one daughter born 2018.  Lives in [place].   Orig from [place].     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   No tobacco, occas alcohol, no drugs.   Exercises 3-5 days per week.   Normal diet.      Review of Systems - See HPI.  All other ROS are negative.  BP 90/60   Pulse 84   Temp 98 F (36.7 C) (Oral)   Resp 14  Ht _0  (1.575 m)   Wt 140 lb (63.5 kg)   SpO2 98%   BMI 25.61 kg/m   Physical Exam  Constitutional: She is oriented to person, place, and time and well-developed, well-nourished, and in no distress.  HENT:  Head: Normocephalic and atraumatic.  Right Ear: Tympanic membrane normal.  Left Ear: Tympanic membrane normal.  Nose: Nose normal.  Mouth/Throat: Uvula is midline.  Eyes: Conjunctivae are normal.  Neck: Neck supple.  Cardiovascular: Normal rate, regular rhythm, normal heart sounds and intact distal pulses.  Pulmonary/Chest: Effort normal and breath sounds normal. No respiratory distress. She has no wheezes. She has no rales. She exhibits no tenderness.  Neurological: She is alert and  oriented to person, place, and time.  Skin: Skin is warm and dry. No rash noted.  Psychiatric: Affect normal.  Vitals reviewed.  Recent Results (from the past 2160 hour(s))  POCT urine pregnancy     Status: None   Collection Time: 11/30/16  2:12 PM  Result Value Ref Range   Preg Test, Ur Negative Negative  CBC w/Diff     Status: Abnormal   Collection Time: 01/30/17 11:13 AM  Result Value Ref Range   WBC 7.1 4.0 - 10.5 K/uL   RBC 5.17 (H) 3.87 - 5.11 Mil/uL   Hemoglobin 14.5 12.0 - 15.0 g/dL   HCT 44.8 36.0 - 46.0 %   MCV 86.6 78.0 - 100.0 fl   MCHC 32.3 30.0 - 36.0 g/dL   RDW 14.1 11.5 - 15.5 %   Platelets 314.0 150.0 - 400.0 K/uL   Neutrophils Relative % 47.4 43.0 - 77.0 %   Lymphocytes Relative 40.8 12.0 - 46.0 %   Monocytes Relative 8.7 3.0 - 12.0 %   Eosinophils Relative 2.4 0.0 - 5.0 %   Basophils Relative 0.7 0.0 - 3.0 %   Neutro Abs 3.3 1.4 - 7.7 K/uL   Lymphs Abs 2.9 0.7 - 4.0 K/uL   Monocytes Absolute 0.6 0.1 - 1.0 K/uL   Eosinophils Absolute 0.2 0.0 - 0.7 K/uL   Basophils Absolute 0.0 0.0 - 0.1 K/uL  Comp Met (CMET)     Status: None   Collection Time: 01/30/17 11:13 AM  Result Value Ref Range   Sodium 142 135 - 145 mEq/L   Potassium 4.3 3.5 - 5.1 mEq/L   Chloride 105 96 - 112 mEq/L   CO2 29 19 - 32 mEq/L   Glucose, Bld 81 70 - 99 mg/dL   BUN 16 6 - 23 mg/dL   Creatinine, Ser 0.69 0.40 - 1.20 mg/dL   Total Bilirubin 0.7 0.2 - 1.2 mg/dL   Alkaline Phosphatase 79 39 - 117 U/L   AST 11 0 - 37 U/L   ALT 10 0 - 35 U/L   Total Protein 7.4 6.0 - 8.3 g/dL   Albumin 4.9 3.5 - 5.2 g/dL   Calcium 9.8 8.4 - 10.5 mg/dL   GFR 103.22 >60.00 mL/min  Sedimentation rate     Status: None   Collection Time: 01/30/17 11:13 AM  Result Value Ref Range   Sed Rate 4 0 - 20 mm/hr    Assessment/Plan: 1. Gastroesophageal reflux disease without esophagitis Patient to start Prilosec daily as directed for two-week period. Check H. Pylori today. Supportive measures reviewed. - H.  pylori antibody, IgG  2. Vasovagal episode Resolved with increased hydration and electrolyte water. Continue recommendations. Cardiology if any recurrence.   Leeanne Rio, PA-C

## 2017-03-07 MED FILL — NORETHINDRONE 0.35 MG TAB: 0.35 | 84 days supply | Qty: 84 | Fill #3

## 2017-03-07 MED FILL — TRIAMCINOLONE 0.1% CREAM: 0.1 | 15 days supply | Qty: 80 | Fill #0

## 2017-04-08 ENCOUNTER — Encounter: Payer: Self-pay | Admitting: Physician Assistant

## 2017-04-08 ENCOUNTER — Ambulatory Visit (INDEPENDENT_AMBULATORY_CARE_PROVIDER_SITE_OTHER): Payer: 59 | Admitting: Physician Assistant

## 2017-04-08 ENCOUNTER — Telehealth: Payer: Self-pay | Admitting: Physician Assistant

## 2017-04-08 ENCOUNTER — Other Ambulatory Visit: Payer: Self-pay

## 2017-04-08 VITALS — BP 114/80 | HR 106 | Temp 99.6°F | Resp 18 | Ht 63.58 in | Wt 142.0 lb

## 2017-04-08 DIAGNOSIS — J101 Influenza due to other identified influenza virus with other respiratory manifestations: Secondary | ICD-10-CM | POA: Diagnosis not present

## 2017-04-08 DIAGNOSIS — R52 Pain, unspecified: Secondary | ICD-10-CM | POA: Diagnosis not present

## 2017-04-08 DIAGNOSIS — R6883 Chills (without fever): Secondary | ICD-10-CM | POA: Diagnosis not present

## 2017-04-08 DIAGNOSIS — R509 Fever, unspecified: Secondary | ICD-10-CM

## 2017-04-08 LAB — POCT INFLUENZA A/B
INFLUENZA A, POC: POSITIVE — AB
Influenza B, POC: NEGATIVE

## 2017-04-08 LAB — POCT RAPID STREP A (OFFICE): Rapid Strep A Screen: NEGATIVE

## 2017-04-08 MED ORDER — OSELTAMIVIR PHOSPHATE 75 MG PO CAPS: 75.0000 mg | ORAL_CAPSULE | Freq: Two times a day (BID) | ORAL | 0 refills | Status: DC

## 2017-04-08 MED FILL — OSELTAMIVIR PHOSPHATE 75 MG: 75 | 5 days supply | Qty: 10 | Fill #0

## 2017-04-08 NOTE — Progress Notes (Signed)
MRN: [id number] DOB: [birth year]  Subjective:   Heather Conley is a 37 y.o. female presenting for chief complaint of Headache; Cough; and Chills (pt state that husband haas flu) .  Reports 1 day history of sudden onset fever, rhinorrhea, dry cough, generalized body aches, fatigue, and chills. Her husband was just dx with the flu.  Has not taken any medication. Denies  sinus pain, ear pain, sore throat, wheezing, shortness of breath and chest pain, nausea, vomiting, abdominal pain and diarrhea. Notes she does have a 42 month old at home that she is currently breastfeeding.  No hx of asthma. Patient has had flu shot this season. Denies smoking. Denies any other aggravating or relieving factors, no other questions or concerns.  Heather Conley has a current medication list which includes the following prescription(s): albuterol, norethindrone, omeprazole, prenatal multivitamin, and triamcinolone cream. Also is allergic to sulfa antibiotics and procardia [nifedipine].  Heather Conley  has a past medical history of GAD (generalized anxiety disorder), Gastritis (Mar/Apr 2014), GERD (gastroesophageal reflux disease), Microscopic hematuria, and TMJ dysfunction (06/2013). Also  has a past surgical history that includes Wisdom tooth extraction (approx 2007) and Cesarean section (N/A, 07/06/2016).   Objective:   Vitals: BP 114/80 (BP Location: Right Arm, Patient Position: Sitting, Cuff Size: Normal)   Pulse (!) 106   Temp 99.6 F (37.6 C) (Oral)   Resp 18   Ht 5' 3.58" (1.615 m)   Wt 142 lb (64.4 kg)   SpO2 98%   Breastfeeding? Yes   BMI 24.70 kg/m   Physical Exam  Constitutional: She is oriented to person, place, and time. She appears well-developed and well-nourished. She appears distressed (appears like she does not feel well sitting on exam table).  HENT:  Head: Normocephalic and atraumatic.  Right Ear: Tympanic membrane, external ear and ear canal normal.  Left Ear: Tympanic membrane, external ear and ear  canal normal.  Nose: Rhinorrhea present. No mucosal edema. Right sinus exhibits no maxillary sinus tenderness and no frontal sinus tenderness. Left sinus exhibits no maxillary sinus tenderness and no frontal sinus tenderness.  Mouth/Throat: Uvula is midline, oropharynx is clear and moist and mucous membranes are normal. No tonsillar exudate.  Eyes: Conjunctivae are normal.  Neck: Normal range of motion.  Pulmonary/Chest: Effort normal and breath sounds normal. She has no wheezes. She has no rhonchi. She has no rales.  Lymphadenopathy:       Head (right side): No submental, no submandibular, no tonsillar, no preauricular, no posterior auricular and no occipital adenopathy present.       Head (left side): No submental, no submandibular, no tonsillar, no preauricular, no posterior auricular and no occipital adenopathy present.    She has no cervical adenopathy.       Right: No supraclavicular adenopathy present.       Left: No supraclavicular adenopathy present.  Neurological: She is alert and oriented to person, place, and time.  Skin: Skin is warm and dry. She is not diaphoretic.  Skin feels very warm to palpation.    Psychiatric: She has a normal mood and affect.  Vitals reviewed.   Results for orders placed or performed in visit on 04/08/17 (from the past 24 hour(s))  POCT Influenza A/B     Status: Abnormal   Collection Time: 04/08/17 11:50 AM  Result Value Ref Range   Influenza A, POC Positive (A) Negative   Influenza B, POC Negative Negative  POCT rapid strep A     Status: Normal  Collection Time: 04/08/17 11:52 AM  Result Value Ref Range   Rapid Strep A Screen Negative Negative    Assessment and Plan :  1. Chills - POCT Influenza A/B - POCT rapid strep A 2. Fever, unspecified 3. Generalized body aches 4. Influenza A POC test positive for influenza A. Given educational material on influenza. Advised to rest, continue to hydrate, and eat light meals. Follow up in sx worsen  or she develops new concerning sx. Encouraged pt to contact pt's pediatrician office and inform them that both parents have tested positive for influenza.  - oseltamivir (TAMIFLU) 75 MG capsule; Take 1 capsule (75 mg total) by mouth 2 (two) times daily.  Dispense: 10 capsule; Refill: 0  Tenna Delaine, PA-C  [facility] 04/08/2017 12:41 PM

## 2017-04-08 NOTE — Telephone Encounter (Signed)
Keep husband at a distance from daughter and from patient is possible. Keep well-hydrated and eat a balanced diet to help immune health.  Hand washing is key! If she notes any flu-like symptoms, have her call or come see [place].

## 2017-04-08 NOTE — Telephone Encounter (Signed)
Spoke with patient. She is aware of precautions listed below.  She is aware to call if she develops any symptoms

## 2017-04-08 NOTE — Patient Instructions (Addendum)
I have given you a Rx for tamilfu. Please contact pediatrician and let them know you and your husband have tested positive for the flu. You are contagious until you are fever free for 24 hours without using tylenol .Please stay out of work until you are no longer contagious. Two major complications after the flu are pneumonia and sinus infections. Please be aware of this and if you are not any better in 7-10 days or you develop worsening cough or sinus pressure, seek care at our clinic or the ED. Continue to wash your hands and wear a mask daily especially around other people.    Influenza, Adult Influenza ("the flu") is an infection in the lungs, nose, and throat (respiratory tract). It is caused by a virus. The flu causes many common cold symptoms, as well as a high fever and body aches. It can make you feel very sick. The flu spreads easily from person to person (is contagious). Getting a flu shot (influenza vaccination) every year is the best way to prevent the flu. Follow these instructions at home:  Take over-the-counter and prescription medicines only as told by your doctor.  Use a cool mist humidifier to add moisture (humidity) to the air in your home. This can make it easier to breathe.  Rest as needed.  Drink enough fluid to keep your pee (urine) clear or pale yellow.  Cover your mouth and nose when you cough or sneeze.  Wash your hands with soap and water often, especially after you cough or sneeze. If you cannot use soap and water, use hand sanitizer.  Stay home from work or school as told by your doctor. Unless you are visiting your doctor, try to avoid leaving home until your fever has been gone for 24 hours without the use of medicine.  Keep all follow-up visits as told by your doctor. This is important. How is this prevented?  Getting a yearly (annual) flu shot is the best way to avoid getting the flu. You may get the flu shot in late summer, fall, or winter. Ask your doctor  when you should get your flu shot.  Wash your hands often or use hand sanitizer often.  Avoid contact with people who are sick during cold and flu season.  Eat healthy foods.  Drink plenty of fluids.  Get enough sleep.  Exercise regularly. Contact a doctor if:  You get new symptoms.  You have: ? Chest pain. ? Watery poop (diarrhea). ? A fever.  Your cough gets worse.  You start to have more mucus.  You feel sick to your stomach (nauseous).  You throw up (vomit). Get help right away if:  You start to be short of breath or have trouble breathing.  Your skin or nails turn a bluish color.  You have very bad pain or stiffness in your neck.  You get a sudden headache.  You get sudden pain in your face or ear.  You cannot stop throwing up. This information is not intended to replace advice given to you by your health care provider. Make sure you discuss any questions you have with your health care provider. Document Released: 12/20/2007 Document Revised: 08/18/2015 Document Reviewed: 01/04/2015 Elsevier Interactive Patient Education  2017 [employer].

## 2017-04-08 NOTE — Telephone Encounter (Signed)
Copied from [place] [id number]. Topic: Quick Communication - See Telephone Encounter >> Apr 08, 2017  9:22 AM Robina Ade, Helene Kelp D wrote: Patient husband has the flu and she would like to know what precautions does she need to take to avoid for her and her 24 month daughter to not get it? Please call patient back, thanks. CRM for notification. See Telephone encounter for: 04/08/17.

## 2017-04-18 ENCOUNTER — Telehealth: Payer: Self-pay | Admitting: Physician Assistant

## 2017-04-18 NOTE — Telephone Encounter (Signed)
Patient needs FMLA Forms completed by Heather Conley for her last OV where she was diagnosed with the flu. I have completed the forms they just need to be signed. I will place the forms in [name]'s box on 04/18/17 please return to the FMLA/DISABILITY desk within 5-7 business days. Thank you!

## 2017-04-24 DIAGNOSIS — H5213 Myopia, bilateral: Secondary | ICD-10-CM | POA: Diagnosis not present

## 2017-04-25 NOTE — Telephone Encounter (Signed)
FMLA forms was signed by B. Timmothy Euler today and placed on C.Cockman desk per R. Berdine Addison

## 2017-04-29 NOTE — Telephone Encounter (Signed)
Paperwork scanned and faxed on 04/29/17

## 2017-05-03 MED FILL — NORETHINDRONE 0.35 MG TAB: 0.35 | 28 days supply | Qty: 28 | Fill #4

## 2017-05-22 DIAGNOSIS — L814 Other melanin hyperpigmentation: Secondary | ICD-10-CM | POA: Diagnosis not present

## 2017-05-22 DIAGNOSIS — D225 Melanocytic nevi of trunk: Secondary | ICD-10-CM | POA: Diagnosis not present

## 2017-05-22 DIAGNOSIS — Z23 Encounter for immunization: Secondary | ICD-10-CM | POA: Diagnosis not present

## 2017-05-22 DIAGNOSIS — D2261 Melanocytic nevi of right upper limb, including shoulder: Secondary | ICD-10-CM | POA: Diagnosis not present

## 2017-05-22 DIAGNOSIS — D2271 Melanocytic nevi of right lower limb, including hip: Secondary | ICD-10-CM | POA: Diagnosis not present

## 2017-05-22 DIAGNOSIS — D1801 Hemangioma of skin and subcutaneous tissue: Secondary | ICD-10-CM | POA: Diagnosis not present

## 2017-05-22 DIAGNOSIS — D485 Neoplasm of uncertain behavior of skin: Secondary | ICD-10-CM | POA: Diagnosis not present

## 2017-06-28 MED FILL — NORETHINDRONE 0.35 MG TAB: 0.35 | 28 days supply | Qty: 28 | Fill #5

## 2017-07-17 ENCOUNTER — Ambulatory Visit (INDEPENDENT_AMBULATORY_CARE_PROVIDER_SITE_OTHER): Payer: 59 | Admitting: Physician Assistant

## 2017-07-17 ENCOUNTER — Other Ambulatory Visit: Payer: Self-pay

## 2017-07-17 ENCOUNTER — Encounter: Payer: Self-pay | Admitting: Physician Assistant

## 2017-07-17 VITALS — BP 104/78 | HR 68 | Temp 98.5°F | Resp 14 | Ht 64.0 in | Wt 132.0 lb

## 2017-07-17 DIAGNOSIS — K219 Gastro-esophageal reflux disease without esophagitis: Secondary | ICD-10-CM

## 2017-07-17 DIAGNOSIS — H9201 Otalgia, right ear: Secondary | ICD-10-CM

## 2017-07-17 MED ORDER — FLUTICASONE PROPIONATE 50 MCG/ACT NA SUSP: 2.0000 | Freq: Every day | NASAL | 0 refills | Status: DC

## 2017-07-17 MED ORDER — OMEPRAZOLE 20 MG PO CPDR: 20.0000 mg | DELAYED_RELEASE_CAPSULE | Freq: Every day | ORAL | 0 refills | Status: DC

## 2017-07-17 NOTE — Progress Notes (Signed)
Patient presents to clinic today c/o continued GERD symptoms. Notes symptoms are intermittent but happening at least 3+ times per week. Notes reflux, belching, bloating and occasional epigastric discomfort. Denies poor diet but does eat quickly (on the run) and sometimes eating last at night. Denies any bloody stool.   Patient also notes a few days of R ear pain and pressure. Denies drainage, ringing or change in hearing. No other URI symptoms. Has history of TMJ but denies pain with eating or ROM currently. Is wearing a night guard while sleeping just in case.  Past Medical History:  Diagnosis Date  . GAD (generalized anxiety disorder)    zoloft in the past helped but sexual side effects caused her to d/c it.  . Gastritis Mar/Apr 2014  . GERD (gastroesophageal reflux disease)    chronic cough--eval by Dr. Shoemaker-ENT-07/22/13.  . Microscopic hematuria    Exam, labs, cysto, and imaging all UNREMARKABLE with Dr. Tresa Moore all NEG--pt to get further eval only if gross hematuria occurs.  . 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 albuterol (VENTOLIN HFA) 108 (90 Base) MCG/ACT inhaler Inhale 1-2 puffs into the lungs every 4 (four) hours as needed for wheezing or shortness of breath. 1 Inhaler 5  . norethindrone (MICRONOR,CAMILA,ERRIN) 0.35 MG tablet Take 1 tablet by mouth daily.  12  . Prenatal Vit-Fe Fumarate-FA (PRENATAL MULTIVITAMIN) TABS tablet Take 1 tablet by mouth daily at 12 noon.    Marland Kitchen omeprazole (PRILOSEC) 40 MG capsule Take 1 capsule (40 mg total) by mouth daily. (Patient not taking: Reported on 07/17/2017) 30 capsule 1   No current facility-administered medications on file prior to visit.     Allergies  Allergen Reactions  . Sulfa Antibiotics     Pt states that both mother and father are allergic, she she thinks that she might be.  . Procardia [Nifedipine] Palpitations    Family  History  Problem Relation Age of Onset  . Alcohol abuse Mother   . Heart disease Maternal Uncle 70  . Heart disease Maternal Grandfather   . Stroke Paternal Grandmother     Social History   Socioeconomic History  . Marital status: Married    Spouse name: [place]  . Number of children: 1  . Years of education: Not on file  . Highest education level: Not on file  Occupational History  . 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 Financial resource strain: Not on file  . Food insecurity:    Worry: Not on file    Inability: Not on file  . Transportation needs:    Medical: Not on file    Non-medical: Not on file  Tobacco Use  . Smoking status: Never Smoker  . Smokeless tobacco: Never Used  Substance and Sexual Activity  . Alcohol use: Yes    Comment: social  . Drug use: No  . Sexual activity: Yes  Lifestyle  . Physical activity:    Days per week: Not on file    Minutes per session: Not on file  . Stress: Not on file  Relationships  . Social connections:    Talks on phone: Not on file    Gets together: Not on file    Attends religious service: Not on file    Active member of club or organization: Not on file  Attends meetings of clubs or organizations: Not on file    Relationship status: Not on file  Other Topics Concern  . Not on file  Social History Narrative   Married, one daughter born 2018.  Lives in [place].   Orig from [place].     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   No tobacco, occas alcohol, no drugs.   Exercises 3-5 days per week.   Normal diet.      Review of Systems - See HPI.  All other ROS are negative.  BP 104/78   Pulse 68   Temp 98.5 F (36.9 C) (Oral)   Resp 14   Ht 5\' 4"  (1.626 m)   Wt 132 lb (59.9 kg)   SpO2 98%   BMI 22.66 kg/m   Physical Exam  Constitutional: She appears well-developed and well-nourished.  HENT:  Head: Normocephalic and atraumatic.  Right  Ear: External ear normal. No drainage or swelling. No mastoid tenderness. Tympanic membrane is not injected, not retracted and not bulging. No middle ear effusion. No hemotympanum.  Left Ear: External ear normal. Tympanic membrane is not retracted and not bulging. A middle ear effusion (mild serous) is present.  Nose: Nose normal.  Mouth/Throat: Uvula is midline.  Cardiovascular: Normal rate, regular rhythm, normal heart sounds and intact distal pulses.  Pulmonary/Chest: Effort normal and breath sounds normal. No stridor. No respiratory distress. She has no wheezes. She has no rales. She exhibits no tenderness.  Abdominal: Soft. She exhibits no distension and no mass. There is no rebound and no guarding. No hernia.  Mild epigastric and LUQ tenderness noted  Vitals reviewed.  Assessment/Plan: 1. Gastroesophageal reflux disease without esophagitis Start Omperazole x 2 weeks. Start GERD diet. Supportive measures reviewed. Follow-up if not improving.   2. Otalgia of right ear Question mild TMJ versus ETD giving history and serous fluid noted on L TM. Start Claritin. Can add on Flonase if needed. Continue mouth guard to prevent nighttime grinding.  - fluticasone (FLONASE) 50 MCG/ACT nasal spray; Place 2 sprays into both nostrils daily.  Dispense: 16 g; Refill: 0   Leeanne Rio, PA-C

## 2017-07-17 NOTE — Patient Instructions (Signed)
Since you are having such an issue and you have cut back with breastfeeding, I would recommend that we start the Omeprazole (low-dose) once daily for 2 weeks for GERD.  Avoid late-night eating or eating too quickly.  Try to do something during the day to calm down as stress an trigger reflux as well. Elevate the head of your bed at night.   For the ear, apply Icy hot or Aspercreme to the area. Start the Flonase, using once daily x 2 weeks. Continue the night guard.  Please call me if symptoms are not improving.    Food Choices for Gastroesophageal Reflux Disease, Adult When you have gastroesophageal reflux disease (GERD), the foods you eat and your eating habits are very important. Choosing the right foods can help ease your discomfort. What guidelines do I need to follow?  Choose fruits, vegetables, whole grains, and low-fat dairy products.  Choose low-fat meat, fish, and poultry.  Limit fats such as oils, salad dressings, butter, nuts, and avocado.  Keep a food diary. This helps you identify foods that cause symptoms.  Avoid foods that cause symptoms. These may be different for everyone.  Eat small meals often instead of 3 large meals a day.  Eat your meals slowly, in a place where you are relaxed.  Limit fried foods.  Cook foods using methods other than frying.  Avoid drinking alcohol.  Avoid drinking large amounts of liquids with your meals.  Avoid bending over or lying down until 2-3 hours after eating. What foods are not recommended? These are some foods and drinks that may make your symptoms worse: Vegetables Tomatoes. Tomato juice. Tomato and spaghetti sauce. Chili peppers. Onion and garlic. Horseradish. Fruits Oranges, grapefruit, and lemon (fruit and juice). Meats High-fat meats, fish, and poultry. This includes hot dogs, ribs, ham, sausage, salami, and bacon. Dairy Whole milk and chocolate milk. Sour cream. Cream. Butter. Ice cream. Cream  cheese. Drinks Coffee and tea. Bubbly (carbonated) drinks or energy drinks. Condiments Hot sauce. Barbecue sauce. Sweets/Desserts Chocolate and cocoa. Donuts. Peppermint and spearmint. Fats and Oils High-fat foods. This includes [place] fries and potato chips. Other Vinegar. Strong spices. This includes black pepper, white pepper, red pepper, cayenne, curry powder, cloves, ginger, and chili powder. The items listed above may not be a complete list of foods and drinks to avoid. Contact your dietitian for more information. This information is not intended to replace advice given to you by your health care provider. Make sure you discuss any questions you have with your health care provider. Document Released: 09/11/2011 Document Revised: 08/18/2015 Document Reviewed: 01/14/2013 Elsevier Interactive Patient Education  2017 [employer].

## 2017-07-26 MED FILL — OMEPRAZOLE 20 MG CAP: 20 | 30 days supply | Qty: 30 | Fill #0

## 2017-07-26 MED FILL — NORETHINDRONE 0.35 MG TAB: 0.35 | 28 days supply | Qty: 28 | Fill #6

## 2017-08-22 DIAGNOSIS — Z01419 Encounter for gynecological examination (general) (routine) without abnormal findings: Secondary | ICD-10-CM | POA: Diagnosis not present

## 2017-08-22 DIAGNOSIS — Z6824 Body mass index (BMI) 24.0-24.9, adult: Secondary | ICD-10-CM | POA: Diagnosis not present

## 2017-11-20 ENCOUNTER — Encounter: Payer: 59 | Admitting: Physician Assistant

## 2017-12-04 ENCOUNTER — Other Ambulatory Visit: Payer: Self-pay

## 2017-12-04 ENCOUNTER — Encounter: Payer: Self-pay | Admitting: Physician Assistant

## 2017-12-04 ENCOUNTER — Ambulatory Visit (INDEPENDENT_AMBULATORY_CARE_PROVIDER_SITE_OTHER): Payer: 59 | Admitting: Physician Assistant

## 2017-12-04 VITALS — BP 110/82 | HR 74 | Temp 98.1°F | Resp 16 | Ht 64.0 in | Wt 139.0 lb

## 2017-12-04 DIAGNOSIS — R5383 Other fatigue: Secondary | ICD-10-CM | POA: Diagnosis not present

## 2017-12-04 DIAGNOSIS — Z0000 Encounter for general adult medical examination without abnormal findings: Secondary | ICD-10-CM

## 2017-12-04 LAB — LIPID PANEL
Cholesterol: 166 mg/dL (ref 0–200)
HDL: 67.2 mg/dL (ref >39.00)
LDL Cholesterol: 88 mg/dL (ref 0–99)
NONHDL: 99.17
Total CHOL/HDL Ratio: 2
Triglycerides: 54 mg/dL (ref 0.0–149.0)
VLDL: 10.8 mg/dL (ref 0.0–40.0)

## 2017-12-04 LAB — COMPREHENSIVE METABOLIC PANEL
ALT: 7 U/L (ref 0–35)
AST: 9 U/L (ref 0–37)
Albumin: 4.2 g/dL (ref 3.5–5.2)
Alkaline Phosphatase: 40 U/L (ref 39–117)
BUN: 13 mg/dL (ref 6–23)
CO2: 30 mEq/L (ref 19–32)
Calcium: 8.9 mg/dL (ref 8.4–10.5)
Chloride: 107 mEq/L (ref 96–112)
Creatinine, Ser: 0.65 mg/dL (ref 0.40–1.20)
GFR: 110.04 mL/min (ref >60.00)
GLUCOSE: 89 mg/dL (ref 70–99)
Potassium: 4.2 mEq/L (ref 3.5–5.1)
Sodium: 140 mEq/L (ref 135–145)
TOTAL PROTEIN: 6.5 g/dL (ref 6.0–8.3)
Total Bilirubin: 0.6 mg/dL (ref 0.2–1.2)

## 2017-12-04 LAB — VITAMIN B12: Vitamin B-12: 209 pg/mL — ABNORMAL LOW (ref 211–911)

## 2017-12-04 LAB — CBC WITH DIFFERENTIAL/PLATELET
BASOS ABS: 0.1 10*3/uL (ref 0.0–0.1)
Basophils Relative: 1.4 % (ref 0.0–3.0)
Eosinophils Absolute: 0.2 10*3/uL (ref 0.0–0.7)
Eosinophils Relative: 4.3 % (ref 0.0–5.0)
HCT: 41.6 % (ref 36.0–46.0)
HEMOGLOBIN: 13.5 g/dL (ref 12.0–15.0)
LYMPHS ABS: 2 10*3/uL (ref 0.7–4.0)
Lymphocytes Relative: 35.9 % (ref 12.0–46.0)
MCHC: 32.4 g/dL (ref 30.0–36.0)
MCV: 87.1 fl (ref 78.0–100.0)
MONOS PCT: 8.7 % (ref 3.0–12.0)
Monocytes Absolute: 0.5 10*3/uL (ref 0.1–1.0)
NEUTROS PCT: 49.7 % (ref 43.0–77.0)
Neutro Abs: 2.8 10*3/uL (ref 1.4–7.7)
Platelets: 294 10*3/uL (ref 150.0–400.0)
RBC: 4.78 Mil/uL (ref 3.87–5.11)
RDW: 13.8 % (ref 11.5–15.5)
WBC: 5.6 10*3/uL (ref 4.0–10.5)

## 2017-12-04 LAB — VITAMIN D 25 HYDROXY (VIT D DEFICIENCY, FRACTURES): VITD: 31.51 ng/mL (ref 30.00–100.00)

## 2017-12-04 LAB — TSH: TSH: 1.95 u[IU]/mL (ref 0.35–4.50)

## 2017-12-04 LAB — SEDIMENTATION RATE: SED RATE: 5 mm/h (ref 0–20)

## 2017-12-04 NOTE — Assessment & Plan Note (Signed)
Depression screen negative. Health Maintenance reviewed. Preventive schedule discussed and handout given in AVS. Will obtain fasting labs today.  

## 2017-12-04 NOTE — Assessment & Plan Note (Signed)
Unclear etiology but seems most likely related to long work hours and stress. Stress relief tactics reviewed with patient. Massage encouraged. Will check lab panel today to include TSH and Vitamin Levels to further assess. An ESR was also added.

## 2017-12-04 NOTE — Progress Notes (Signed)
Patient presents to clinic today for annual exam.  Patient is fasting for labs.  Diet -- Endorses making improvements in dietary choices and portion sizes.   Exercise -- working out 3 x weekly - cardio and resistance  Acute Concerns: Patient endorses a few weeks of fatigue. Is not sure if this is stemming from working long-shifts and stress related to this or something else. Has noted feeling more foggy headed. Denies change in vision or AMS. Denies anxiety from baseline GAD. Denies depressed mood or anhedonia.  Health Maintenance: Immunizations -- Tetanus up-to-date. Declines flu shot today. Will be getting at work later in the month.  PAP -- up-to-date.   Past Medical History:  Diagnosis Date  . GAD (generalized anxiety disorder)    zoloft in the past helped but sexual side effects caused her to d/c it.  . Gastritis Mar/Apr 2014  . GERD (gastroesophageal reflux disease)    chronic cough--eval by Dr. Shoemaker-ENT-07/22/13.  . Microscopic hematuria    Exam, labs, cysto, and imaging all UNREMARKABLE with Dr. Tresa Moore all NEG--pt to get further eval only if gross hematuria occurs.  . 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 CESAREAN SECTION N/A 07/06/2016   Procedure: CESAREAN SECTION;  Surgeon: Tyson Dense, MD;  Location: [facility];  Service: Obstetrics;  Laterality: N/A;  . WISDOM TOOTH EXTRACTION  approx 7793   No complications    Current Outpatient Medications on File Prior to Visit  Medication Sig Dispense Refill  . Prenatal Vit-Fe Fumarate-FA (PRENATAL MULTIVITAMIN) TABS tablet Take 1 tablet by mouth daily at 12 noon.     No current facility-administered medications on file prior to visit.     Allergies  Allergen Reactions  . Sulfa Antibiotics     Pt states that both mother and father are allergic, she she thinks that she might be.  . Procardia [Nifedipine] Palpitations    Family History    Problem Relation Age of Onset  . Alcohol abuse Mother   . Heart disease Maternal Uncle 70  . Heart disease Maternal Grandfather   . Stroke Paternal Grandmother     Social History   Socioeconomic History  . Marital status: Married    Spouse name: [place]  . Number of children: 1  . Years of education: Not on file  . Highest education level: Not on file  Occupational History  . 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 Financial resource strain: Not on file  . Food insecurity:    Worry: Not on file    Inability: Not on file  . Transportation needs:    Medical: Not on file    Non-medical: Not on file  Tobacco Use  . Smoking status: Never Smoker  . Smokeless tobacco: Never Used  Substance and Sexual Activity  . Alcohol use: Yes    Comment: social  . Drug use: No  . Sexual activity: Yes  Lifestyle  . Physical activity:    Days per week: Not on file    Minutes per session: Not on file  . Stress: Not on file  Relationships  . Social connections:    Talks on phone: Not on file    Gets together: Not on file    Attends religious service: Not on file    Active member of club or organization: Not on file  Attends meetings of clubs or organizations: Not on file    Relationship status: Not on file  . Intimate partner violence:    Fear of current or ex partner: Not on file    Emotionally abused: Not on file    Physically abused: Not on file    Forced sexual activity: Not on file  Other Topics Concern  . Not on file  Social History Narrative   Married, one daughter born 2018.  Lives in [place].   Orig from [place].     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   No tobacco, occas alcohol, no drugs.   Exercises 3-5 days per week.   Normal diet.      Review of Systems  Constitutional: Negative for fever and weight loss.  HENT: Negative for ear discharge, ear pain, hearing loss and tinnitus.   Eyes:  Negative for blurred vision, double vision, photophobia and pain.  Respiratory: Negative for cough and shortness of breath.   Cardiovascular: Negative for chest pain and palpitations.  Gastrointestinal: Negative for abdominal pain, blood in stool, constipation, diarrhea, heartburn, melena, nausea and vomiting.  Genitourinary: Negative for dysuria, flank pain, frequency, hematuria and urgency.  Musculoskeletal: Negative for falls.  Neurological: Negative for dizziness, loss of consciousness and headaches.  Endo/Heme/Allergies: Negative for environmental allergies.  Psychiatric/Behavioral: Negative for depression, hallucinations, substance abuse and suicidal ideas. The patient is not nervous/anxious and does not have insomnia.    BP 110/82   Pulse 74   Temp 98.1 F (36.7 C) (Oral)   Resp 16   Ht '5\' 4"'  (1.626 m)   Wt 139 lb (63 kg)   SpO2 98%   BMI 23.86 kg/m   Physical Exam  Constitutional: She is oriented to person, place, and time.  HENT:  Head: Normocephalic and atraumatic.  Right Ear: Tympanic membrane, external ear and ear canal normal.  Left Ear: Tympanic membrane, external ear and ear canal normal.  Nose: Nose normal. No mucosal edema.  Mouth/Throat: Uvula is midline, oropharynx is clear and moist and mucous membranes are normal. No oropharyngeal exudate or posterior oropharyngeal erythema.  Eyes: Pupils are equal, round, and reactive to light. Conjunctivae are normal.  Neck: Neck supple. No thyromegaly present.  Cardiovascular: Normal rate, regular rhythm, normal heart sounds and intact distal pulses.  Pulmonary/Chest: Effort normal and breath sounds normal. No respiratory distress. She has no wheezes. She has no rales.  Abdominal: Soft. Bowel sounds are normal. She exhibits no distension and no mass. There is no tenderness. There is no rebound and no guarding.  Lymphadenopathy:    She has no cervical adenopathy.  Neurological: She is alert and oriented to person, place, and  time. No cranial nerve deficit.  Skin: Skin is warm and dry. No rash noted.  Vitals reviewed.  Assessment/Plan: Visit for preventive health examination Depression screen negative. Health Maintenance reviewed. Preventive schedule discussed and handout given in AVS. Will obtain fasting labs today.   Fatigue Unclear etiology but seems most likely related to long work hours and stress. Stress relief tactics reviewed with patient. Massage encouraged. Will check lab panel today to include TSH and Vitamin Levels to further assess. An ESR was also added.     Leeanne Rio, PA-C

## 2017-12-04 NOTE — Patient Instructions (Signed)
Please go to the lab for blood work.   Our office will call you with your results unless you have chosen to receive results via MyChart.  If your blood work is normal we will follow-up each year for physicals and as scheduled for chronic medical problems.  If anything is abnormal we will treat accordingly and get you in for a follow-up.   Please keep hydrated and get plenty of rest. I recommend trying to get a massage every couple of weeks to help alleviate tension. Try applying some Icy hot or Aspercreme to the posterior neck to see if this helps with some of your symptoms. Try to relax as much as possible.  We will alter regimen based on lab results.   Preventive Care 18-39 Years, Female Preventive care refers to lifestyle choices and visits with your health care provider that can promote health and wellness. What does preventive care include?  A yearly physical exam. This is also called an annual well check.  Dental exams once or twice a year.  Routine eye exams. Ask your health care provider how often you should have your eyes checked.  Personal lifestyle choices, including: ? Daily care of your teeth and gums. ? Regular physical activity. ? Eating a healthy diet. ? Avoiding tobacco and drug use. ? Limiting alcohol use. ? Practicing safe sex. ? Taking vitamin and mineral supplements as recommended by your health care provider. What happens during an annual well check? The services and screenings done by your health care provider during your annual well check will depend on your age, overall health, lifestyle risk factors, and family history of disease. Counseling Your health care provider may ask you questions about your:  Alcohol use.  Tobacco use.  Drug use.  Emotional well-being.  Home and relationship well-being.  Sexual activity.  Eating habits.  Work and work environment.  Method of birth control.  Menstrual cycle.  Pregnancy  history.  Screening You may have the following tests or measurements:  Height, weight, and BMI.  Diabetes screening. This is done by checking your blood sugar (glucose) after you have not eaten for a while (fasting).  Blood pressure.  Lipid and cholesterol levels. These may be checked every 5 years starting at age 20.  Skin check.  Hepatitis C blood test.  Hepatitis B blood test.  Sexually transmitted disease (STD) testing.  BRCA-related cancer screening. This may be done if you have a family history of breast, ovarian, tubal, or peritoneal cancers.  Pelvic exam and Pap test. This may be done every 3 years starting at age 21. Starting at age 30, this may be done every 5 years if you have a Pap test in combination with an HPV test.  Discuss your test results, treatment options, and if necessary, the need for more tests with your health care provider. Vaccines Your health care provider may recommend certain vaccines, such as:  Influenza vaccine. This is recommended every year.  Tetanus, diphtheria, and acellular pertussis (Tdap, Td) vaccine. You may need a Td booster every 10 years.  Varicella vaccine. You may need this if you have not been vaccinated.  HPV vaccine. If you are 26 or younger, you may need three doses over 6 months.  Measles, mumps, and rubella (MMR) vaccine. You may need at least one dose of MMR. You may also need a second dose.  Pneumococcal 13-valent conjugate (PCV13) vaccine. You may need this if you have certain conditions and were not previously vaccinated.  Pneumococcal polysaccharide (  PPSV23) vaccine. You may need one or two doses if you smoke cigarettes or if you have certain conditions.  Meningococcal vaccine. One dose is recommended if you are age 36-21 years and a first-year college student living in a residence hall, or if you have one of several medical conditions. You may also need additional booster doses.  Hepatitis A vaccine. You may need  this if you have certain conditions or if you travel or work in places where you may be exposed to hepatitis A.  Hepatitis B vaccine. You may need this if you have certain conditions or if you travel or work in places where you may be exposed to hepatitis B.  Haemophilus influenzae type b (Hib) vaccine. You may need this if you have certain risk factors.  Talk to your health care provider about which screenings and vaccines you need and how often you need them. This information is not intended to replace advice given to you by your health care provider. Make sure you discuss any questions you have with your health care provider. Document Released: 05/08/2001 Document Revised: 11/30/2015 Document Reviewed: 01/11/2015 Elsevier Interactive Patient Education  [employer].

## 2017-12-05 ENCOUNTER — Telehealth: Payer: Self-pay | Admitting: Physician Assistant

## 2017-12-05 ENCOUNTER — Other Ambulatory Visit: Payer: Self-pay

## 2017-12-05 DIAGNOSIS — E538 Deficiency of other specified B group vitamins: Secondary | ICD-10-CM

## 2017-12-05 NOTE — Telephone Encounter (Unsigned)
Copied from [place] [id number]. Topic: Quick Communication - See Telephone Encounter >> Dec 05, 2017 10:08 AM Neva Seat wrote: Pt needing a call back on labs.  She has a question on one that was low. Please call pt back to discuss.

## 2017-12-05 NOTE — Telephone Encounter (Signed)
Called patient and gave her lab results. Patient verbalized an understanding. 

## 2018-01-01 ENCOUNTER — Other Ambulatory Visit: Payer: 59

## 2018-03-12 ENCOUNTER — Ambulatory Visit: Payer: Self-pay

## 2018-03-12 ENCOUNTER — Ambulatory Visit (INDEPENDENT_AMBULATORY_CARE_PROVIDER_SITE_OTHER): Payer: 59 | Admitting: Physician Assistant

## 2018-03-12 ENCOUNTER — Encounter: Payer: Self-pay | Admitting: Physician Assistant

## 2018-03-12 ENCOUNTER — Ambulatory Visit: Payer: 59 | Admitting: Physician Assistant

## 2018-03-12 ENCOUNTER — Other Ambulatory Visit: Payer: Self-pay

## 2018-03-12 VITALS — BP 100/70 | HR 75 | Temp 98.0°F | Resp 14 | Ht 64.0 in | Wt 141.0 lb

## 2018-03-12 DIAGNOSIS — E069 Thyroiditis, unspecified: Secondary | ICD-10-CM

## 2018-03-12 LAB — TSH: TSH: 0.95 u[IU]/mL (ref 0.35–4.50)

## 2018-03-12 LAB — T4, FREE: FREE T4: 0.9 ng/dL (ref 0.60–1.60)

## 2018-03-12 LAB — T3, FREE: T3, Free: 3.9 pg/mL (ref 2.3–4.2)

## 2018-03-12 MED ORDER — METHYLPREDNISOLONE 4 MG PO TBPK: ORAL_TABLET | ORAL | 0 refills | Status: DC

## 2018-03-12 NOTE — Telephone Encounter (Signed)
Please advise 

## 2018-03-12 NOTE — Telephone Encounter (Signed)
Medicine was given for thyroiditis. Nothing to do with her menstrual period. Good to note that she wants to get pregnant in the near future. For now, should be ok with a glass of wine with medicine but do not go overboard.

## 2018-03-12 NOTE — Telephone Encounter (Signed)
Patient informed of provider recommendations. Patient verbalized understanding  Doloris Hall,  LPN

## 2018-03-12 NOTE — Telephone Encounter (Signed)
  Pt stated she was just prescribed methylPREDNISolone (MEDROL DOSEPAK) 4 MG TBPK tablet. She took the first dose today because she started her period. Pt stated that her period lasts 4 days. She will still have 2 days left of the steroids and she will start trying to get pregnant as soon as her period is over. Pt asked that she may have a few glasses of wine this weekend and was asking if alcohol is ok to drink while on steroids.   Reason for Disposition . [1] Caller requesting NON-URGENT health information AND [2] PCP's office is the best resource  Answer Assessment - Initial Assessment Questions 1. REASON FOR CALL or QUESTION: "What is your reason for calling today?" or "How can I best help you?" or "What question do you have that I can help answer?"     Pt stated she was just prescribed methylPREDNISolone (MEDROL DOSEPAK) 4 MG TBPK tablet. She took the first dose today because she started her period. Pt stated that her period lasts 4 days. She will still have 2 days left of the steroids and she will start trying to get pregnant as soon as her period is over. Pt asked that she may have a few glasses of wine this weekend and was asking if alcohol is ok to drink while on steroids.  Protocols used: INFORMATION ONLY CALL-A-AH

## 2018-03-12 NOTE — Patient Instructions (Signed)
Please go to the lab today for blood work.  I will call you with your results. We will alter treatment regimen(s) if indicated by your results.   Please take the steroid as directed to calm inflammation. Apply ice to the area.  Can also consider topical Icy Hot.  If you note any symptoms of overactive thyroid developing -- increased anxiety, jitteriness, diarrhea, racing heart, please let us know ASAP. I do not feel this will happen but want to monitor.   Symptoms should ease themselves with medication.

## 2018-03-12 NOTE — Progress Notes (Signed)
Patient presents to clinic today c/o few days of anterior neck tenderness with R > L . Initially thought she must be getting sick as she had a mild sore throat and thought lymph nodes were enlarged. This resolved but discomfort of anterior neck has remained. Denies fever, chills, nausea or vomiting. Denies difficulty swallowing or breathing. Denies anxiety, racing heart, LH or dizziness..   Past Medical History:  Diagnosis Date  . GAD (generalized anxiety disorder)    zoloft in the past helped but sexual side effects caused her to d/c it.  . Gastritis Mar/Apr 2014  . GERD (gastroesophageal reflux disease)    chronic cough--eval by Dr. Shoemaker-ENT-07/22/13.  . Microscopic hematuria    Exam, labs, cysto, and imaging all UNREMARKABLE with Dr. Tresa Moore all NEG--pt to get further eval only if gross hematuria occurs.  . 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 Prenatal Vit-Fe Fumarate-FA (PRENATAL MULTIVITAMIN) TABS tablet Take 1 tablet by mouth daily at 12 noon.     No current facility-administered medications on file prior to visit.     Allergies  Allergen Reactions  . Sulfa Antibiotics     Pt states that both mother and father are allergic, she she thinks that she might be.  . Procardia [Nifedipine] Palpitations    Family History  Problem Relation Age of Onset  . Alcohol abuse Mother   . Heart disease Maternal Uncle 70  . Heart disease Maternal Grandfather   . Stroke Paternal Grandmother     Social History   Socioeconomic History  . Marital status: Married    Spouse name: [place]  . Number of children: 1  . Years of education: Not on file  . Highest education level: Not on file  Occupational History  . Occupation: Optician, dispensing: [employer]    Comment: [employer]  Social Needs  . Financial resource strain: Not on file  . Food insecurity:    Worry: Not on file    Inability:  Not on file  . Transportation needs:    Medical: Not on file    Non-medical: Not on file  Tobacco Use  . Smoking status: Never Smoker  . Smokeless tobacco: Never Used  Substance and Sexual Activity  . Alcohol use: Yes    Comment: social  . Drug use: No  . Sexual activity: Yes  Lifestyle  . Physical activity:    Days per week: Not on file    Minutes per session: Not on file  . Stress: Not on file  Relationships  . Social connections:    Talks on phone: Not on file    Gets together: Not on file    Attends religious service: Not on file    Active member of club or organization: Not on file    Attends meetings of clubs or organizations: Not on file    Relationship status: Not on file  Other Topics Concern  . Not on file  Social History Narrative   Married, one daughter born 2018.  Lives in [place].   Orig from [place].     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   No tobacco, occas alcohol, no drugs.   Exercises 3-5 days per week.   Normal diet.      Review of Systems - See HPI.  All other ROS are negative.  BP 100/70   Pulse 75   Temp 98 F (36.7 C) (Oral)   Resp 14   Ht 5\' 4"  (1.626 m)   Wt 141 lb (64 kg)   SpO2 98%   Breastfeeding No   BMI 24.20 kg/m   Physical Exam Vitals signs and nursing note reviewed.  Constitutional:      Appearance: Normal appearance.  HENT:     Head: Normocephalic and atraumatic.     Right Ear: Tympanic membrane and ear canal normal.     Left Ear: Tympanic membrane and ear canal normal.     Nose: Nose normal.     Mouth/Throat:     Mouth: Mucous membranes are moist.  Eyes:     Conjunctiva/sclera: Conjunctivae normal.  Neck:     Musculoskeletal: Neck supple. Normal range of motion. No edema, neck rigidity, spinous process tenderness or muscular tenderness.     Thyroid: Thyroid tenderness present. No thyroid mass.  Cardiovascular:     Rate and Rhythm: Normal rate and regular rhythm.     Pulses: Normal  pulses.     Heart sounds: Normal heart sounds.  Pulmonary:     Effort: Pulmonary effort is normal.     Breath sounds: Normal breath sounds.  Neurological:     Mental Status: She is alert.  Psychiatric:        Mood and Affect: Mood normal.    Assessment/Plan: 1. Thyroiditis Afebrile. No goiter noted but thyroid is tender without mass. No symptoms of hyperthyroidism but will check thyroid studies. Start Medrol pack for inflammation. Tylenol for breakthrough pain. Will monitor closely but reassurance given to patient.  - TSH - T4, free - T3, free - methylPREDNISolone (MEDROL DOSEPAK) 4 MG TBPK tablet; Take following package directions.  Dispense: 21 tablet; Refill: 0   Leeanne Rio, PA-C

## 2018-04-11 ENCOUNTER — Ambulatory Visit: Payer: 59 | Admitting: Physician Assistant

## 2018-05-15 ENCOUNTER — Ambulatory Visit: Payer: 59 | Admitting: Family Medicine

## 2018-05-16 ENCOUNTER — Ambulatory Visit: Payer: 59 | Admitting: Physician Assistant

## 2018-05-19 ENCOUNTER — Ambulatory Visit: Payer: 59 | Admitting: Physician Assistant

## 2018-05-21 ENCOUNTER — Ambulatory Visit: Payer: 59 | Admitting: Physician Assistant

## 2018-05-22 ENCOUNTER — Other Ambulatory Visit: Payer: Self-pay

## 2018-05-22 ENCOUNTER — Encounter: Payer: Self-pay | Admitting: Physician Assistant

## 2018-05-22 ENCOUNTER — Ambulatory Visit (INDEPENDENT_AMBULATORY_CARE_PROVIDER_SITE_OTHER): Payer: 59 | Admitting: Physician Assistant

## 2018-05-22 VITALS — BP 90/70 | HR 65 | Temp 98.0°F | Resp 14 | Ht 64.0 in | Wt 145.0 lb

## 2018-05-22 DIAGNOSIS — R1084 Generalized abdominal pain: Secondary | ICD-10-CM | POA: Diagnosis not present

## 2018-05-22 LAB — POCT URINALYSIS DIPSTICK
Bilirubin, UA: NEGATIVE
Glucose, UA: NEGATIVE
NITRITE UA: NEGATIVE
PROTEIN UA: NEGATIVE
Spec Grav, UA: >=1.03 — AB (ref 1.010–1.025)
Urobilinogen, UA: 0.2 E.U./dL
pH, UA: 6 (ref 5.0–8.0)

## 2018-05-22 NOTE — Patient Instructions (Signed)
Please keep hydrated and get plenty of rest. Tylenol or NSAID for pain currently. We are sending urine for further assessment to rule out mild infection. I feel this is related to hormonal fluctuations from stopping OCPs. Please schedule follow-up with GYN.  We will call once urine results are in.

## 2018-05-22 NOTE — Progress Notes (Signed)
Patient presents to clinic today c/o intermittent lower abdominal/pelvic pain bilaterally over the past week. Denies any change to urinary or bowel habits. Denies fever, chills, malaise. She recently stopped her OCPS and is questioning if hormonal fluctuations are contributing as she has history of ovarian cyst. LMP was a couple of weeks ago. She and husband are actively trying to get pregnant.  Patient denies any heartburn, nausea or vomiting. Is eating well and keeping hydrated. Was unable to get in with her GYN for another 2 weeks.  Past Medical History:  Diagnosis Date  . GAD (generalized anxiety disorder)    zoloft in the past helped but sexual side effects caused her to d/c it.  . Gastritis Mar/Apr 2014  . GERD (gastroesophageal reflux disease)    chronic cough--eval by Dr. Shoemaker-ENT-07/22/13.  . Microscopic hematuria    Exam, labs, cysto, and imaging all UNREMARKABLE with Dr. Tresa Moore all NEG--pt to get further eval only if gross hematuria occurs.  . 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 Prenatal Vit-Fe Fumarate-FA (PRENATAL MULTIVITAMIN) TABS tablet Take 1 tablet by mouth daily at 12 noon.     No current facility-administered medications on file prior to visit.     Allergies  Allergen Reactions  . Sulfa Antibiotics     Pt states that both mother and father are allergic, she she thinks that she might be.  . Procardia [Nifedipine] Palpitations    Family History  Problem Relation Age of Onset  . Alcohol abuse Mother   . Heart disease Maternal Uncle 70  . Heart disease Maternal Grandfather   . Stroke Paternal Grandmother     Social History   Socioeconomic History  . Marital status: Married    Spouse name: [place]  . Number of children: 1  . Years of education: Not on file  . Highest education level: Not on file  Occupational History  . Occupation: Optician, dispensing:  [employer]    Comment: [employer]  Social Needs  . Financial resource strain: Not on file  . Food insecurity:    Worry: Not on file    Inability: Not on file  . Transportation needs:    Medical: Not on file    Non-medical: Not on file  Tobacco Use  . Smoking status: Never Smoker  . Smokeless tobacco: Never Used  Substance and Sexual Activity  . Alcohol use: Yes    Comment: social  . Drug use: No  . Sexual activity: Yes  Lifestyle  . Physical activity:    Days per week: Not on file    Minutes per session: Not on file  . Stress: Not on file  Relationships  . Social connections:    Talks on phone: Not on file    Gets together: Not on file    Attends religious service: Not on file    Active member of club or organization: Not on file    Attends meetings of clubs or organizations: Not on file    Relationship status: Not on file  Other Topics Concern  . Not on file  Social History Narrative   Married, one daughter born 2018.  Lives in [place].   Orig from [place].     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   No tobacco, occas alcohol, no drugs.   Exercises 3-5  days per week.   Normal diet.      Review of Systems - See HPI.  All other ROS are negative.  BP 90/70   Pulse 65   Temp 98 F (36.7 C) (Oral)   Resp 14   Ht 5\' 4"  (1.626 m)   Wt 145 lb (65.8 kg)   SpO2 99%   Breastfeeding Yes   BMI 24.89 kg/m   Physical Exam Vitals signs reviewed.  Constitutional:      Appearance: She is well-developed.  HENT:     Head: Normocephalic.  Cardiovascular:     Rate and Rhythm: Normal rate and regular rhythm.     Heart sounds: Normal heart sounds.  Pulmonary:     Effort: Pulmonary effort is normal.     Breath sounds: Normal breath sounds.  Abdominal:     General: Abdomen is flat. Bowel sounds are normal.     Palpations: Abdomen is soft.     Tenderness: There is no abdominal tenderness. There is no right CVA tenderness or left CVA tenderness.       Hernia: No hernia is present.  Neurological:     Mental Status: She is alert.     Recent Results (from the past 2160 hour(s))  TSH     Status: None   Collection Time: 03/12/18  9:03 AM  Result Value Ref Range   TSH 0.95 0.35 - 4.50 uIU/mL  T4, free     Status: None   Collection Time: 03/12/18  9:03 AM  Result Value Ref Range   Free T4 0.90 0.60 - 1.60 ng/dL    Comment: Specimens from patients who are undergoing biotin therapy and /or ingesting biotin supplements may contain high levels of biotin.  The higher biotin concentration in these specimens interferes with this Free T4 assay.  Specimens that contain high levels  of biotin may cause false high results for this Free T4 assay.  Please interpret results in light of the total clinical presentation of the patient.    T3, free     Status: None   Collection Time: 03/12/18  9:03 AM  Result Value Ref Range   T3, Free 3.9 2.3 - 4.2 pg/mL    Assessment/Plan: 1. Generalized abdominal pain Exam unremarkable today. Urine preg negative. Urine dip unremarkable. Will send for culture. I do feel symptoms are related to hormonal fluctuations giving timing. Supportive measures and OTC medications reviewed. She is to call GYN to expedite appt. If unable, we will proceed with pelvic US. - POCT Urinalysis Dipstick - POCT urine pregnancy - Urine Culture   Leeanne Rio, PA-C

## 2018-05-24 LAB — URINE CULTURE
MICRO NUMBER:: 254359
SPECIMEN QUALITY:: ADEQUATE

## 2018-05-27 LAB — POCT URINE PREGNANCY: Preg Test, Ur: NEGATIVE

## 2018-05-29 DIAGNOSIS — N940 Mittelschmerz: Secondary | ICD-10-CM | POA: Diagnosis not present

## 2018-05-29 DIAGNOSIS — N946 Dysmenorrhea, unspecified: Secondary | ICD-10-CM | POA: Diagnosis not present

## 2018-05-29 DIAGNOSIS — Z3202 Encounter for pregnancy test, result negative: Secondary | ICD-10-CM | POA: Diagnosis not present

## 2018-05-29 DIAGNOSIS — R102 Pelvic and perineal pain: Secondary | ICD-10-CM | POA: Diagnosis not present

## 2018-06-11 ENCOUNTER — Encounter: Payer: 59 | Admitting: Physician Assistant

## 2018-09-17 DIAGNOSIS — N911 Secondary amenorrhea: Secondary | ICD-10-CM | POA: Diagnosis not present

## 2018-09-23 DIAGNOSIS — Z3685 Encounter for antenatal screening for Streptococcus B: Secondary | ICD-10-CM | POA: Diagnosis not present

## 2018-09-23 DIAGNOSIS — Z3481 Encounter for supervision of other normal pregnancy, first trimester: Secondary | ICD-10-CM | POA: Diagnosis not present

## 2018-09-25 MED FILL — AMOXICILLIN 500 MG CAPSULE: 500 | 5 days supply | Qty: 10 | Fill #0

## 2018-10-15 ENCOUNTER — Ambulatory Visit (HOSPITAL_COMMUNITY)
Admission: RE | Admit: 2018-10-15 | Discharge: 2018-10-15 | Disposition: A | Discharge status: Home / Self Care | Payer: 59 | Source: Ambulatory Visit | Attending: Physician Assistant | Admitting: Physician Assistant

## 2018-10-15 ENCOUNTER — Other Ambulatory Visit (HOSPITAL_COMMUNITY): Payer: Self-pay | Admitting: Obstetrics and Gynecology

## 2018-10-15 ENCOUNTER — Other Ambulatory Visit: Payer: Self-pay

## 2018-10-15 DIAGNOSIS — M7989 Other specified soft tissue disorders: Secondary | ICD-10-CM | POA: Diagnosis not present

## 2018-10-15 DIAGNOSIS — Z3491 Encounter for supervision of normal pregnancy, unspecified, first trimester: Secondary | ICD-10-CM | POA: Diagnosis not present

## 2018-10-15 DIAGNOSIS — R2240 Localized swelling, mass and lump, unspecified lower limb: Secondary | ICD-10-CM | POA: Diagnosis not present

## 2018-10-15 DIAGNOSIS — M79605 Pain in left leg: Secondary | ICD-10-CM | POA: Diagnosis not present

## 2018-10-15 DIAGNOSIS — Z113 Encounter for screening for infections with a predominantly sexual mode of transmission: Secondary | ICD-10-CM | POA: Diagnosis not present

## 2018-10-15 NOTE — Progress Notes (Signed)
Left lower extremity venous duplex completed. Preliminary results in Chart review CV Proc. [facility], [place] 10/15/2018, 2:53 PM

## 2018-10-21 DIAGNOSIS — Z3682 Encounter for antenatal screening for nuchal translucency: Secondary | ICD-10-CM | POA: Diagnosis not present

## 2018-10-21 DIAGNOSIS — Z3A12 12 weeks gestation of pregnancy: Secondary | ICD-10-CM | POA: Diagnosis not present

## 2018-10-21 DIAGNOSIS — O09521 Supervision of elderly multigravida, first trimester: Secondary | ICD-10-CM | POA: Diagnosis not present

## 2018-10-21 DIAGNOSIS — O09899 Supervision of other high risk pregnancies, unspecified trimester: Secondary | ICD-10-CM | POA: Diagnosis not present

## 2018-10-21 DIAGNOSIS — H5213 Myopia, bilateral: Secondary | ICD-10-CM | POA: Diagnosis not present

## 2018-11-18 DIAGNOSIS — Z363 Encounter for antenatal screening for malformations: Secondary | ICD-10-CM | POA: Diagnosis not present

## 2018-12-10 ENCOUNTER — Encounter: Payer: 59 | Admitting: Physician Assistant

## 2018-12-17 DIAGNOSIS — Z3400 Encounter for supervision of normal first pregnancy, unspecified trimester: Secondary | ICD-10-CM | POA: Diagnosis not present

## 2018-12-17 DIAGNOSIS — Z363 Encounter for antenatal screening for malformations: Secondary | ICD-10-CM | POA: Diagnosis not present

## 2018-12-17 DIAGNOSIS — Z3A20 20 weeks gestation of pregnancy: Secondary | ICD-10-CM | POA: Diagnosis not present

## 2018-12-17 DIAGNOSIS — O09522 Supervision of elderly multigravida, second trimester: Secondary | ICD-10-CM | POA: Diagnosis not present

## 2019-01-16 DIAGNOSIS — Z3480 Encounter for supervision of other normal pregnancy, unspecified trimester: Secondary | ICD-10-CM | POA: Diagnosis not present

## 2019-02-26 DIAGNOSIS — O3663X0 Maternal care for excessive fetal growth, third trimester, not applicable or unspecified: Secondary | ICD-10-CM | POA: Diagnosis not present

## 2019-02-26 DIAGNOSIS — Z3493 Encounter for supervision of normal pregnancy, unspecified, third trimester: Secondary | ICD-10-CM | POA: Diagnosis not present

## 2019-02-26 DIAGNOSIS — Z3A31 31 weeks gestation of pregnancy: Secondary | ICD-10-CM | POA: Diagnosis not present

## 2019-03-31 DIAGNOSIS — Z3685 Encounter for antenatal screening for Streptococcus B: Secondary | ICD-10-CM | POA: Diagnosis not present

## 2019-04-06 DIAGNOSIS — Z3482 Encounter for supervision of other normal pregnancy, second trimester: Secondary | ICD-10-CM | POA: Diagnosis not present

## 2019-04-06 DIAGNOSIS — Z3483 Encounter for supervision of other normal pregnancy, third trimester: Secondary | ICD-10-CM | POA: Diagnosis not present

## 2019-04-07 ENCOUNTER — Encounter (HOSPITAL_COMMUNITY): Payer: Self-pay

## 2019-04-07 ENCOUNTER — Encounter (HOSPITAL_COMMUNITY): Payer: Self-pay | Admitting: *Deleted

## 2019-04-07 NOTE — Patient Instructions (Signed)
Heather Conley  04/07/2019   Your procedure is scheduled on:  04/23/2019  Arrive at 60 at Entrance C on [address] at [facility]  and [employer] [employer] [address]. You are invited to use the FREE valet parking or use the Visitor's parking deck.  Pick up the phone at the desk and dial [phone].  Call this number if you have problems the morning of surgery: [phone]  Remember:   Do not eat food:(After Midnight) Desps de medianoche.  Do not drink clear liquids: (After Midnight) Desps de medianoche.  Take these medicines the morning of surgery with A SIP OF WATER:  none   Do not wear jewelry, make-up or nail polish.  Do not wear lotions, powders, or perfumes. Do not wear deodorant.  Do not shave 48 hours prior to surgery.  Do not bring valuables to the hospital.  [facility] is not   responsible for any belongings or valuables brought to the hospital.  Contacts, dentures or bridgework may not be worn into surgery.  Leave suitcase in the car. After surgery it may be brought to your room.  For patients admitted to the hospital, checkout time is 11:00 AM the day of              discharge.      Please read over the following fact sheets that you were given:     Preparing for Surgery

## 2019-04-14 DIAGNOSIS — O26843 Uterine size-date discrepancy, third trimester: Secondary | ICD-10-CM | POA: Diagnosis not present

## 2019-04-14 DIAGNOSIS — Z3A37 37 weeks gestation of pregnancy: Secondary | ICD-10-CM | POA: Diagnosis not present

## 2019-04-14 DIAGNOSIS — Z3400 Encounter for supervision of normal first pregnancy, unspecified trimester: Secondary | ICD-10-CM | POA: Diagnosis not present

## 2019-04-21 ENCOUNTER — Other Ambulatory Visit (HOSPITAL_COMMUNITY)
Admission: RE | Admit: 2019-04-21 | Discharge: 2019-04-21 | Disposition: A | Discharge status: Home / Self Care | Payer: 59 | Source: Ambulatory Visit | Attending: Obstetrics & Gynecology | Admitting: Obstetrics & Gynecology

## 2019-04-21 ENCOUNTER — Other Ambulatory Visit: Payer: Self-pay

## 2019-04-21 DIAGNOSIS — Z20822 Contact with and (suspected) exposure to covid-19: Secondary | ICD-10-CM | POA: Diagnosis not present

## 2019-04-21 DIAGNOSIS — Z3A39 39 weeks gestation of pregnancy: Secondary | ICD-10-CM | POA: Diagnosis not present

## 2019-04-21 DIAGNOSIS — O34211 Maternal care for low transverse scar from previous cesarean delivery: Secondary | ICD-10-CM | POA: Diagnosis not present

## 2019-04-21 HISTORY — DX: Other complications of anesthesia, initial encounter: T88.59XA

## 2019-04-21 LAB — CBC
HCT: 39.6 % (ref 36.0–46.0)
Hemoglobin: 12.8 g/dL (ref 12.0–15.0)
MCH: 27.6 pg (ref 26.0–34.0)
MCHC: 32.3 g/dL (ref 30.0–36.0)
MCV: 85.5 fL (ref 80.0–100.0)
Platelets: 254 10*3/uL (ref 150–400)
RBC: 4.63 MIL/uL (ref 3.87–5.11)
RDW: 15.2 % (ref 11.5–15.5)
WBC: 9.3 10*3/uL (ref 4.0–10.5)
nRBC: 0 % (ref 0.0–0.2)

## 2019-04-21 LAB — TYPE AND SCREEN
ABO/RH(D): O POS
Antibody Screen: NEGATIVE

## 2019-04-21 LAB — ABO/RH: ABO/RH(D): O POS

## 2019-04-21 LAB — RPR: RPR Ser Ql: NONREACTIVE

## 2019-04-21 LAB — SARS CORONAVIRUS 2 (TAT 6-24 HRS): SARS Coronavirus 2: NEGATIVE

## 2019-04-21 NOTE — MAU Note (Signed)
Asymptomatic, swab collected. Lab here for blood work 

## 2019-04-21 NOTE — H&P (Signed)
Heather Conley is a 39 y.o. female G2P1001 at 57 weeks presenting for repeat C/S.  Antepartum course uncomplicated.  Patient is AMA with low risk NIPS; female.  GBS negative.  OB History    Gravida  2   Para  1   Term  1   Preterm      AB      Living  1     SAB      TAB      Ectopic      Multiple  0   Live Births  1          Past Medical History:  Diagnosis Date  . Complication of anesthesia    itching post epidural  . GAD (generalized anxiety disorder)    zoloft in the past helped but sexual side effects caused her to d/c it.  . Gastritis Mar/Apr 2014  . GERD (gastroesophageal reflux disease)    chronic cough--eval by Dr. Shoemaker-ENT-07/22/13.  . Microscopic hematuria    Exam, labs, cysto, and imaging all UNREMARKABLE with Dr. Tresa Moore all NEG--pt to get further eval only if gross hematuria occurs.  . 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 CESAREAN SECTION N/A 07/06/2016   Procedure: CESAREAN SECTION;  Surgeon: Tyson Dense, MD;  Location: [place];  Service: Obstetrics;  Laterality: N/A;  . WISDOM TOOTH EXTRACTION  approx [id number]   No complications   Family History: family history includes Alcohol abuse in her mother; Heart disease in her maternal grandfather; Heart disease (age of onset: 9) in her maternal uncle; Stroke in her paternal grandmother. Social History:  reports that she has never smoked. She has never used smokeless tobacco. She reports current alcohol use. She reports that she does not use drugs.     Maternal Diabetes: No Genetic Screening: Normal Maternal Ultrasounds/Referrals: Normal Fetal Ultrasounds or other Referrals:  None Maternal Substance Abuse:  No Significant Maternal Medications:  None Significant Maternal Lab Results:  Group B Strep negative Other Comments:  None  Review of Systems Maternal Medical History:  Prenatal complications: no prenatal  complications Prenatal Complications - Diabetes: none.      Last menstrual period 07/24/2018, currently breastfeeding. Maternal Exam:  Abdomen: Patient reports no abdominal tenderness. Surgical scars: low transverse.   Fundal height is S>D.   Estimated fetal weight is 9#.       Physical Exam  Constitutional: She is oriented to person, place, and time. She appears well-developed and well-nourished.  Respiratory: Effort normal.  GI: Soft. There is no rebound and no guarding.  Musculoskeletal:        General: Normal range of motion.  Neurological: She is alert and oriented to person, place, and time.  Skin: Skin is warm and dry.  Psychiatric: She has a normal mood and affect. Her behavior is normal.    Prenatal labs: ABO, Rh:  O pos Antibody:  negative  Rubella:  Immune RPR:   NR HBsAg:   Negative HIV:   NR GBS:   Negative  Assessment/Plan: 38yo G2P1001 at 39 weeks for repeat C/S Patient has been counseled re: risk of bleeding, infection, scarring, and damage to surrounding structures.  All questions were answered and the patient is ready to proceed.   Linda Hedges 04/21/2019, 7:06 AM

## 2019-04-22 NOTE — Anesthesia Preprocedure Evaluation (Addendum)
Anesthesia Evaluation  Patient identified by MRN, date of birth, ID band Patient awake    Reviewed: Allergy & Precautions, NPO status , Patient's Chart, lab work & pertinent test results  Airway Mallampati: II  TM Distance: >3 FB Neck ROM: Full    Dental no notable dental hx.    Pulmonary neg pulmonary ROS,    Pulmonary exam normal breath sounds clear to auscultation       Cardiovascular negative cardio ROS Normal cardiovascular exam Rhythm:Regular Rate:Normal     Neuro/Psych PSYCHIATRIC DISORDERS Anxiety negative neurological ROS     GI/Hepatic negative GI ROS, Neg liver ROS,   Endo/Other  negative endocrine ROS  Renal/GU negative Renal ROS     Musculoskeletal negative musculoskeletal ROS (+)   Abdominal (+) + obese,   Peds  Hematology negative hematology ROS (+)   Anesthesia Other Findings Previous C-S  Reproductive/Obstetrics (+) Pregnancy                            Anesthesia Physical Anesthesia Plan  ASA: II and emergent  Anesthesia Plan: Spinal   Post-op Pain Management:    Induction:   PONV Risk Score and Plan: 2 and Ondansetron, Dexamethasone and Treatment may vary due to age or medical condition  Airway Management Planned: Natural Airway  Additional Equipment:   Intra-op Plan:   Post-operative Plan:   Informed Consent: I have reviewed the patients History and Physical, chart, labs and discussed the procedure including the risks, benefits and alternatives for the proposed anesthesia with the patient or authorized representative who has indicated his/her understanding and acceptance.     Dental advisory given  Plan Discussed with: CRNA  Anesthesia Plan Comments:         Anesthesia Quick Evaluation

## 2019-04-23 ENCOUNTER — Inpatient Hospital Stay (HOSPITAL_COMMUNITY)
Admission: AD | Admit: 2019-04-23 | Discharge: 2019-04-25 | DRG: 788 | Disposition: A | Discharge status: Home / Self Care | Payer: 59 | Attending: Obstetrics & Gynecology | Admitting: Obstetrics & Gynecology

## 2019-04-23 ENCOUNTER — Inpatient Hospital Stay (HOSPITAL_COMMUNITY): Payer: 59 | Admitting: Anesthesiology

## 2019-04-23 ENCOUNTER — Other Ambulatory Visit: Payer: Self-pay

## 2019-04-23 ENCOUNTER — Encounter (HOSPITAL_COMMUNITY): Payer: Self-pay | Admitting: Obstetrics & Gynecology

## 2019-04-23 ENCOUNTER — Inpatient Hospital Stay (HOSPITAL_COMMUNITY): Admission: RE | Admit: 2019-04-23 | Payer: 59 | Source: Home / Self Care | Admitting: Obstetrics & Gynecology

## 2019-04-23 ENCOUNTER — Encounter (HOSPITAL_COMMUNITY)
Admission: AD | Disposition: A | Discharge status: Home / Self Care | Payer: Self-pay | Source: Home / Self Care | Attending: Obstetrics & Gynecology

## 2019-04-23 DIAGNOSIS — O411230 Chorioamnionitis, third trimester, not applicable or unspecified: Secondary | ICD-10-CM | POA: Diagnosis not present

## 2019-04-23 DIAGNOSIS — Z3A39 39 weeks gestation of pregnancy: Secondary | ICD-10-CM | POA: Diagnosis not present

## 2019-04-23 DIAGNOSIS — Z20822 Contact with and (suspected) exposure to covid-19: Secondary | ICD-10-CM | POA: Diagnosis present

## 2019-04-23 DIAGNOSIS — Z98891 History of uterine scar from previous surgery: Secondary | ICD-10-CM

## 2019-04-23 DIAGNOSIS — O34211 Maternal care for low transverse scar from previous cesarean delivery: Secondary | ICD-10-CM | POA: Diagnosis not present

## 2019-04-23 SURGERY — Surgical Case
Anesthesia: Spinal

## 2019-04-23 MED ORDER — CEFAZOLIN SODIUM-DEXTROSE 2-4 GM/100ML-% IV SOLN
2.0000 g | INTRAVENOUS | Status: AC
  Administered 2019-04-23: 08:00:00 2 g via INTRAVENOUS
  Filled 2019-04-23: qty 100

## 2019-04-23 MED ORDER — ONDANSETRON HCL 4 MG/2ML IJ SOLN: 4.0000 mg | Freq: Three times a day (TID) | INTRAMUSCULAR | Status: DC | PRN

## 2019-04-23 MED ORDER — FENTANYL CITRATE (PF) 100 MCG/2ML IJ SOLN: 100.0000 ug | Freq: Once | INTRAMUSCULAR | Status: DC

## 2019-04-23 MED ORDER — MEPERIDINE HCL 25 MG/ML IJ SOLN
INTRAMUSCULAR | Status: AC
  Filled 2019-04-23: qty 1

## 2019-04-23 MED ORDER — ACETAMINOPHEN 10 MG/ML IV SOLN: 1000.0000 mg | Freq: Once | INTRAVENOUS | Status: DC | PRN

## 2019-04-23 MED ORDER — MEPERIDINE HCL 25 MG/ML IJ SOLN
6.2500 mg | INTRAMUSCULAR | Status: DC | PRN
  Administered 2019-04-23: 6.25 mg via INTRAVENOUS

## 2019-04-23 MED ORDER — ACETAMINOPHEN 500 MG PO TABS: 1000.0000 mg | ORAL_TABLET | Freq: Once | ORAL | Status: DC

## 2019-04-23 MED ORDER — SCOPOLAMINE 1 MG/3DAYS TD PT72
MEDICATED_PATCH | TRANSDERMAL | Status: AC
  Filled 2019-04-23: qty 1

## 2019-04-23 MED ORDER — NALOXONE HCL 4 MG/10ML IJ SOLN
1.0000 ug/kg/h | INTRAVENOUS | Status: DC | PRN
  Filled 2019-04-23: qty 5

## 2019-04-23 MED ORDER — NALBUPHINE HCL 10 MG/ML IJ SOLN: 5.0000 mg | INTRAMUSCULAR | Status: DC | PRN

## 2019-04-23 MED ORDER — NALBUPHINE HCL 10 MG/ML IJ SOLN: 5.0000 mg | Freq: Once | INTRAMUSCULAR | Status: DC | PRN

## 2019-04-23 MED ORDER — IBUPROFEN 800 MG PO TABS
800.0000 mg | ORAL_TABLET | Freq: Four times a day (QID) | ORAL | Status: DC
  Administered 2019-04-23 – 2019-04-25 (×8): 800 mg via ORAL
  Filled 2019-04-23 (×8): qty 1

## 2019-04-23 MED ORDER — DIPHENHYDRAMINE HCL 25 MG PO CAPS: 25.0000 mg | ORAL_CAPSULE | Freq: Four times a day (QID) | ORAL | Status: DC | PRN

## 2019-04-23 MED ORDER — TERBUTALINE SULFATE 1 MG/ML IJ SOLN
INTRAMUSCULAR | Status: AC
  Filled 2019-04-23: qty 1

## 2019-04-23 MED ORDER — KETOROLAC TROMETHAMINE 30 MG/ML IJ SOLN
30.0000 mg | Freq: Once | INTRAMUSCULAR | Status: AC
  Administered 2019-04-23: 09:00:00 30 mg via INTRAVENOUS

## 2019-04-23 MED ORDER — DIPHENHYDRAMINE HCL 50 MG/ML IJ SOLN: 12.5000 mg | Freq: Four times a day (QID) | INTRAMUSCULAR | Status: DC | PRN

## 2019-04-23 MED ORDER — MORPHINE SULFATE (PF) 0.5 MG/ML IJ SOLN
INTRAMUSCULAR | Status: DC | PRN
  Administered 2019-04-23: 150 ug via INTRATHECAL

## 2019-04-23 MED ORDER — SIMETHICONE 80 MG PO CHEW
80.0000 mg | CHEWABLE_TABLET | ORAL | Status: DC
  Administered 2019-04-23: 23:00:00 80 mg via ORAL
  Filled 2019-04-23 (×2): qty 1

## 2019-04-23 MED ORDER — ZOLPIDEM TARTRATE 5 MG PO TABS: 5.0000 mg | ORAL_TABLET | Freq: Every evening | ORAL | Status: DC | PRN

## 2019-04-23 MED ORDER — SCOPOLAMINE 1 MG/3DAYS TD PT72
1.0000 | MEDICATED_PATCH | Freq: Once | TRANSDERMAL | Status: DC
  Administered 2019-04-23: 09:00:00 1.5 mg via TRANSDERMAL

## 2019-04-23 MED ORDER — LACTATED RINGERS IV SOLN: INTRAVENOUS | Status: DC

## 2019-04-23 MED ORDER — NALOXONE HCL 0.4 MG/ML IJ SOLN: 0.4000 mg | INTRAMUSCULAR | Status: DC | PRN

## 2019-04-23 MED ORDER — OXYCODONE-ACETAMINOPHEN 5-325 MG PO TABS: 1.0000 | ORAL_TABLET | ORAL | Status: DC | PRN

## 2019-04-23 MED ORDER — SIMETHICONE 80 MG PO CHEW: 80.0000 mg | CHEWABLE_TABLET | ORAL | Status: DC | PRN

## 2019-04-23 MED ORDER — PRENATAL MULTIVITAMIN CH
1.0000 | ORAL_TABLET | Freq: Every day | ORAL | Status: DC
  Filled 2019-04-23 (×2): qty 1

## 2019-04-23 MED ORDER — DEXAMETHASONE SODIUM PHOSPHATE 10 MG/ML IJ SOLN
INTRAMUSCULAR | Status: DC | PRN
  Administered 2019-04-23: 10 mg via INTRAVENOUS

## 2019-04-23 MED ORDER — WITCH HAZEL-GLYCERIN EX PADS: 1.0000 "application " | MEDICATED_PAD | CUTANEOUS | Status: DC | PRN

## 2019-04-23 MED ORDER — PHENYLEPHRINE HCL-NACL 20-0.9 MG/250ML-% IV SOLN
INTRAVENOUS | Status: DC | PRN
  Administered 2019-04-23: 60 ug/min via INTRAVENOUS

## 2019-04-23 MED ORDER — SODIUM CHLORIDE 0.9 % IV SOLN: INTRAVENOUS | Status: DC | PRN

## 2019-04-23 MED ORDER — FAMOTIDINE IN NACL 20-0.9 MG/50ML-% IV SOLN
20.0000 mg | Freq: Once | INTRAVENOUS | Status: AC
  Administered 2019-04-23: 20 mg via INTRAVENOUS
  Filled 2019-04-23: qty 50

## 2019-04-23 MED ORDER — ONDANSETRON HCL 4 MG/2ML IJ SOLN
INTRAMUSCULAR | Status: DC | PRN
  Administered 2019-04-23: 4 mg via INTRAVENOUS

## 2019-04-23 MED ORDER — PHENYLEPHRINE 40 MCG/ML (10ML) SYRINGE FOR IV PUSH (FOR BLOOD PRESSURE SUPPORT)
PREFILLED_SYRINGE | INTRAVENOUS | Status: DC | PRN
  Administered 2019-04-23: 60 ug via INTRAVENOUS

## 2019-04-23 MED ORDER — OXYTOCIN 40 UNITS IN NORMAL SALINE INFUSION - SIMPLE MED: 2.5000 [IU]/h | INTRAVENOUS | Status: AC

## 2019-04-23 MED ORDER — SOD CITRATE-CITRIC ACID 500-334 MG/5ML PO SOLN
ORAL | Status: AC
  Filled 2019-04-23: qty 30

## 2019-04-23 MED ORDER — FENTANYL CITRATE (PF) 100 MCG/2ML IJ SOLN
INTRAMUSCULAR | Status: DC | PRN
  Administered 2019-04-23: 15 ug via INTRATHECAL

## 2019-04-23 MED ORDER — BUPIVACAINE IN DEXTROSE 0.75-8.25 % IT SOLN
INTRATHECAL | Status: DC | PRN
  Administered 2019-04-23: 1.5 mL via INTRATHECAL

## 2019-04-23 MED ORDER — KETOROLAC TROMETHAMINE 30 MG/ML IJ SOLN
INTRAMUSCULAR | Status: AC
  Filled 2019-04-23: qty 1

## 2019-04-23 MED ORDER — OXYTOCIN 40 UNITS IN NORMAL SALINE INFUSION - SIMPLE MED
INTRAVENOUS | Status: DC | PRN
  Administered 2019-04-23: 40 mL via INTRAVENOUS

## 2019-04-23 MED ORDER — HYDROMORPHONE HCL 1 MG/ML IJ SOLN: 0.2500 mg | INTRAMUSCULAR | Status: DC | PRN

## 2019-04-23 MED ORDER — ACETAMINOPHEN 325 MG PO TABS
650.0000 mg | ORAL_TABLET | ORAL | Status: DC | PRN
  Administered 2019-04-25: 10:00:00 650 mg via ORAL
  Filled 2019-04-23 (×2): qty 2

## 2019-04-23 MED ORDER — TETANUS-DIPHTH-ACELL PERTUSSIS 5-2.5-18.5 LF-MCG/0.5 IM SUSP: 0.5000 mL | Freq: Once | INTRAMUSCULAR | Status: DC

## 2019-04-23 MED ORDER — DIPHENHYDRAMINE HCL 25 MG PO CAPS: 25.0000 mg | ORAL_CAPSULE | ORAL | Status: DC | PRN

## 2019-04-23 MED ORDER — PROMETHAZINE HCL 25 MG/ML IJ SOLN: 6.2500 mg | INTRAMUSCULAR | Status: DC | PRN

## 2019-04-23 MED ORDER — DIBUCAINE (PERIANAL) 1 % EX OINT: 1.0000 "application " | TOPICAL_OINTMENT | CUTANEOUS | Status: DC | PRN

## 2019-04-23 MED ORDER — TERBUTALINE SULFATE 1 MG/ML IJ SOLN
0.2500 mg | Freq: Once | INTRAMUSCULAR | Status: AC
  Administered 2019-04-23: 07:00:00 0.25 mg via SUBCUTANEOUS

## 2019-04-23 MED ORDER — SOD CITRATE-CITRIC ACID 500-334 MG/5ML PO SOLN: 30.0000 mL | Freq: Once | ORAL | Status: DC

## 2019-04-23 MED ORDER — SODIUM CHLORIDE 0.9 % IR SOLN
Status: DC | PRN
  Administered 2019-04-23 (×3): 1

## 2019-04-23 MED ORDER — COCONUT OIL OIL
1.0000 "application " | TOPICAL_OIL | Status: DC | PRN
  Administered 2019-04-25: 1 via TOPICAL

## 2019-04-23 MED ORDER — FAMOTIDINE IN NACL 20-0.9 MG/50ML-% IV SOLN: 20.0000 mg | Freq: Once | INTRAVENOUS | Status: DC

## 2019-04-23 MED ORDER — SENNOSIDES-DOCUSATE SODIUM 8.6-50 MG PO TABS
2.0000 | ORAL_TABLET | ORAL | Status: DC
  Administered 2019-04-23: 2 via ORAL
  Filled 2019-04-23: qty 2

## 2019-04-23 MED ORDER — SIMETHICONE 80 MG PO CHEW
80.0000 mg | CHEWABLE_TABLET | Freq: Three times a day (TID) | ORAL | Status: DC
  Administered 2019-04-23 – 2019-04-24 (×4): 80 mg via ORAL
  Filled 2019-04-23 (×5): qty 1

## 2019-04-23 MED ORDER — FAMOTIDINE 20 MG PO TABS
20.0000 mg | ORAL_TABLET | Freq: Once | ORAL | Status: DC
  Filled 2019-04-23: qty 1

## 2019-04-23 MED ORDER — MENTHOL 3 MG MT LOZG: 1.0000 | LOZENGE | OROMUCOSAL | Status: DC | PRN

## 2019-04-23 MED ORDER — SODIUM CHLORIDE 0.9% FLUSH: 3.0000 mL | INTRAVENOUS | Status: DC | PRN

## 2019-04-23 SURGICAL SUPPLY — 35 items
BENZOIN TINCTURE PRP APPL 2/3 (GAUZE/BANDAGES/DRESSINGS) ×3 IMPLANT
CHLORAPREP W/TINT 26ML (MISCELLANEOUS) ×3 IMPLANT
CLAMP CORD UMBIL (MISCELLANEOUS) IMPLANT
CLOSURE STERI STRIP 1/2 X4 (GAUZE/BANDAGES/DRESSINGS) ×3 IMPLANT
CLOSURE WOUND 1/2 X4 (GAUZE/BANDAGES/DRESSINGS)
CLOTH BEACON ORANGE TIMEOUT ST (SAFETY) ×3 IMPLANT
DERMABOND ADVANCED (GAUZE/BANDAGES/DRESSINGS)
DERMABOND ADVANCED .7 DNX12 (GAUZE/BANDAGES/DRESSINGS) IMPLANT
DRAPE C SECTION CLR SCREEN (DRAPES) ×3 IMPLANT
DRSG OPSITE POSTOP 4X10 (GAUZE/BANDAGES/DRESSINGS) ×3 IMPLANT
ELECT REM PT RETURN 9FT ADLT (ELECTROSURGICAL) ×3
ELECTRODE REM PT RTRN 9FT ADLT (ELECTROSURGICAL) ×1 IMPLANT
EXTRACTOR VACUUM KIWI (MISCELLANEOUS) ×3 IMPLANT
GLOVE BIO SURGEON STRL SZ 6 (GLOVE) ×3 IMPLANT
GLOVE BIOGEL PI IND STRL 6 (GLOVE) ×2 IMPLANT
GLOVE BIOGEL PI IND STRL 7.0 (GLOVE) ×1 IMPLANT
GLOVE BIOGEL PI INDICATOR 6 (GLOVE) ×4
GLOVE BIOGEL PI INDICATOR 7.0 (GLOVE) ×2
GOWN STRL REUS W/TWL LRG LVL3 (GOWN DISPOSABLE) ×6 IMPLANT
KIT ABG SYR 3ML LUER SLIP (SYRINGE) ×3 IMPLANT
NEEDLE HYPO 25X5/8 SAFETYGLIDE (NEEDLE) ×3 IMPLANT
NS IRRIG 1000ML POUR BTL (IV SOLUTION) ×3 IMPLANT
PACK C SECTION WH (CUSTOM PROCEDURE TRAY) ×3 IMPLANT
PAD OB MATERNITY 4.3X12.25 (PERSONAL CARE ITEMS) ×3 IMPLANT
PENCIL SMOKE EVAC W/HOLSTER (ELECTROSURGICAL) ×3 IMPLANT
STRIP CLOSURE SKIN 1/2X4 (GAUZE/BANDAGES/DRESSINGS) IMPLANT
SUT CHROMIC 0 CTX 36 (SUTURE) ×9 IMPLANT
SUT MON AB 2-0 CT1 27 (SUTURE) ×3 IMPLANT
SUT PDS AB 0 CT1 27 (SUTURE) IMPLANT
SUT PLAIN 0 NONE (SUTURE) IMPLANT
SUT VIC AB 0 CT1 36 (SUTURE) IMPLANT
SUT VIC AB 4-0 KS 27 (SUTURE) IMPLANT
TOWEL OR 17X24 6PK STRL BLUE (TOWEL DISPOSABLE) ×3 IMPLANT
TRAY FOLEY W/BAG SLVR 14FR LF (SET/KITS/TRAYS/PACK) IMPLANT
WATER STERILE IRR 1000ML POUR (IV SOLUTION) ×3 IMPLANT

## 2019-04-23 NOTE — Lactation Note (Signed)
This note was copied from a baby's chart. Lactation Consultation Note  Patient Name: Heather Conley [id number] Date: 04/23/2019   Initial visit at 4 hours of life. Mom is a P2 who nursed her 1st child for 1 year (exclusively for 8 months, followed by mixed feeding for the next 4 months. Mom said the last 4 months was probably about 70% breast milk/30% formula). Mom felt like she made "just enough" the whole time, but she also recalls being able to express 4-6 oz/pumping session.   When I entered room, infant was latched and nursing. His jaw drops seemed consistent with frequent, intermittent swallows. Mom was comfortable with latch & pleased with how well he was doing.   Mom was made aware of O/P services, breastfeeding support groups,and our phone # for post-discharge questions. Mom's only questions were about introducing a bottle/starting pumping, which were answered.    Matthias Hughs [facility] 04/23/2019, 12:46 PM

## 2019-04-23 NOTE — Anesthesia Procedure Notes (Signed)
Spinal  Patient location during procedure: OR Start time: 04/23/2019 7:45 AM End time: 04/23/2019 7:50 AM Staffing Performed: anesthesiologist  Anesthesiologist: Murvin Natal, MD Preanesthetic Checklist Completed: patient identified, IV checked, risks and benefits discussed, surgical consent, monitors and equipment checked, pre-op evaluation and timeout performed Spinal Block Patient position: sitting Prep: DuraPrep Patient monitoring: cardiac monitor, continuous pulse ox and blood pressure Approach: midline Location: L3-4 Injection technique: single-shot Needle Needle type: Pencan  Needle gauge: 24 G Needle length: 9 cm Assessment Sensory level: T10 Events: paresthesia Additional Notes Functioning IV was confirmed and monitors were applied. Sterile prep and drape, including hand hygiene and sterile gloves were used. The patient was positioned and the spine was prepped. The skin was anesthetized with lidocaine.  Transient right sided paresthesia on the first attempt with immediate resolution. Needle withdrawn and re-directed to the left. Free flow of clear CSF was obtained prior to injecting local anesthetic into the CSF.  The spinal needle aspirated freely following injection.  The needle was carefully withdrawn.  The patient tolerated the procedure well.

## 2019-04-23 NOTE — Anesthesia Postprocedure Evaluation (Signed)
Anesthesia Post Note  Patient: Heather Conley  Procedure(s) Performed: CESAREAN SECTION (N/A )     Patient location during evaluation: PACU Anesthesia Type: Spinal Level of consciousness: oriented and awake and alert Pain management: pain level controlled Vital Signs Assessment: post-procedure vital signs reviewed and stable Respiratory status: spontaneous breathing, respiratory function stable and patient connected to nasal cannula oxygen Cardiovascular status: blood pressure returned to baseline and stable Postop Assessment: no headache, no backache, no apparent nausea or vomiting and spinal receding Anesthetic complications: no    Last Vitals:  Vitals:   04/23/19 1006 04/23/19 1120  BP: (!) 100/50 117/61  Pulse: 74 60  Resp:  18  Temp: (!) 36.4 C   SpO2: 99% 98%    Last Pain:  Vitals:   04/23/19 1541  TempSrc:   PainSc: 2    Pain Goal:                   Karyl Kinnier Leanard Dimaio

## 2019-04-23 NOTE — MAU Note (Signed)
Presents with ctxs every 2 minutes apart.  Denies LOF or VB.  Reports unsure if FM.  Scheduled for repeat Cesarean @ 1230

## 2019-04-23 NOTE — Op Note (Signed)
Heather Conley PROCEDURE DATE: 04/23/2019  PREOPERATIVE DIAGNOSIS: Intrauterine pregnancy at  [redacted]w[redacted]d weeks gestation, previous C/S with labor  POSTOPERATIVE DIAGNOSIS: The same  PROCEDURE:  Repeat Low Transverse Cesarean Section  SURGEON:  Dr. Linda Hedges  INDICATIONS: Heather Conley is a 39 y.o. G2P1001 at [redacted]w[redacted]d scheduled for cesarean section secondary to previous C/S x 1 and labor.  The risks of cesarean section discussed with the patient included but were not limited to: bleeding which may require transfusion or reoperation; infection which may require antibiotics; injury to bowel, bladder, ureters or other surrounding organs; injury to the fetus; need for additional procedures including hysterectomy in the event of a life-threatening hemorrhage; placental abnormalities wth subsequent pregnancies, incisional problems, thromboembolic phenomenon and other postoperative/anesthesia complications. The patient concurred with the proposed plan, giving informed written consent for the procedure.    FINDINGS:  Viable female infant in cephalic presentation, APGARs 8,9: weight pending  Meconium stained amniotic fluid.  Intact placenta, three vessel cord.  Grossly normal uterus, ovaries and fallopian tubes. .   ANESTHESIA:  Spinal ESTIMATED BLOOD LOSS: 600 ml SPECIMENS: Placenta sent to pathology COMPLICATIONS: None immediate  PROCEDURE IN DETAIL:  The patient received intravenous antibiotics and had sequential compression devices applied to her lower extremities while in the preoperative area.  She was then taken to the operating room where spinal anesthesia was administered and was found to be adequate. She was then placed in a dorsal supine position with a leftward tilt, and prepped and draped in a sterile manner.  A foley catheter was placed into her bladder and attached to constant gravity.  After an adequate timeout was performed, a Pfannenstiel skin incision was made with scalpel and carried  through to the underlying layer of fascia. The fascia was incised in the midline and this incision was extended bilaterally using the Mayo scissors. Kocher clamps were applied to the superior aspect of the fascial incision and the underlying rectus muscles were dissected off bluntly. A similar process was carried out on the inferior aspect of the facial incision. The rectus muscles were separated in the midline bluntly and the peritoneum was entered bluntly. Bladder flap was created sharply and developed bluntly.  Bladder blade was placed.  A transverse hysterotomy was made with a scalpel and extended bilaterally bluntly. The bladder blade was then removed. The infant was successfully delivered using a single Kiwi vacuum pull, and cord was clamped and cut and infant was handed over to awaiting neonatology team. Uterine massage was then administered and the placenta delivered intact with three-vessel cord. The uterus was cleared of clot and debris.  The hysterotomy was closed with 0 chromic.  A second imbricating suture of 0-chromic was used to reinforce the incision and aid in hemostasis.  The peritoneum and rectus muscles were noted to be hemostatic and were reapproximated using 3-0 monocryl in a running fashion.  The fascia was closed with 0-PDS in a running fashion with good restoration of anatomy.  The subcutaneus tissue was copiously irrigated.  The skin was closed with 4-0 vicryl in a subcuticular fashion.  Pt tolerated the procedure will.  All counts were correct x2.  Pt went to the recovery room in stable condition.

## 2019-04-23 NOTE — H&P (Addendum)
Heather Conley is a 39 y.o. female G2P1001 at 28 weeks presenting in labor for repeat C/S.  Antepartum course uncomplicated.  Patient is AMA with low risk NIPS; female.  GBS negative. . OB History    Gravida  2   Para  1   Term  1   Preterm      AB      Living  1     SAB      TAB      Ectopic      Multiple  0   Live Births  1          Past Medical History:  Diagnosis Date  . Complication of anesthesia    itching post epidural  . GAD (generalized anxiety disorder)    zoloft in the past helped but sexual side effects caused her to d/c it.  . Gastritis Mar/Apr 2014  . GERD (gastroesophageal reflux disease)    chronic cough--eval by Dr. Shoemaker-ENT-07/22/13.  . Microscopic hematuria    Exam, labs, cysto, and imaging all UNREMARKABLE with Dr. Tresa Moore all NEG--pt to get further eval only if gross hematuria occurs.  . 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 CESAREAN SECTION N/A 07/06/2016   Procedure: CESAREAN SECTION;  Surgeon: Tyson Dense, MD;  Location: [facility];  Service: Obstetrics;  Laterality: N/A;  . WISDOM TOOTH EXTRACTION  approx [id number]   No complications   Family History: family history includes Alcohol abuse in her mother; Heart disease in her maternal grandfather; Heart disease (age of onset: 65) in her maternal uncle; Stroke in her paternal grandmother. Social History:  reports that she has never smoked. She has never used smokeless tobacco. She reports current alcohol use. She reports that she does not use drugs.     Maternal Diabetes: No Genetic Screening: Normal Maternal Ultrasounds/Referrals: Normal Fetal Ultrasounds or other Referrals:  None Maternal Substance Abuse:  No Significant Maternal Medications:  None Significant Maternal Lab Results:  Group B Strep negative Other Comments:  None  Review of Systems Maternal Medical History:  Reason for admission:  Contractions.   Contractions: Onset was 3-5 hours ago.   Frequency: regular.   Perceived severity is strong.    Fetal activity: Perceived fetal activity is normal.   Last perceived fetal movement was within the past hour.    Prenatal complications: no prenatal complications Prenatal Complications - Diabetes: none.    Dilation: 2 Effacement (%): 80 Station: -1 Exam by:: Reed Pandy, RN Blood pressure 121/85, pulse (!) 115, temperature 98.1 F (36.7 C), temperature source Oral, resp. rate 20, last menstrual period 07/24/2018, SpO2 96 %, currently breastfeeding. Maternal Exam:  Uterine Assessment: Contraction strength is firm.  Contraction frequency is regular.   Abdomen: Patient reports no abdominal tenderness. Surgical scars: low transverse.   Fundal height is S>D.   Estimated fetal weight is 9#.       Physical Exam  Constitutional: She is oriented to person, place, and time. She appears well-developed and well-nourished.  Respiratory: Effort normal.  GI: Soft.  Musculoskeletal:        General: Normal range of motion.  Neurological: She is alert and oriented to person, place, and time.  Skin: Skin is warm and dry.  Psychiatric: She has a normal mood and affect. Her behavior is normal.    Prenatal labs: ABO, Rh: --/--/O POS, O POS Performed at [facility], [address] [place], [place] [address]  [phone]) Antibody: NEG (01/26 0845) Rubella:  Immune RPR: NON REACTIVE (01/26 0845)  HBsAg:   NR HIV:   Negative GBS:   Negative  Assessment/Plan: 39yo G2P1001 at [redacted]w[redacted]d with labor for repeat C/S Patient has been counseled re: risk of bleeding, infection, scarring, and damage to surrounding structures.  All questions were answered and the patient wishes to proceed.   Heather Conley 04/23/2019, 7:37 AM

## 2019-04-23 NOTE — Transfer of Care (Signed)
Immediate Anesthesia Transfer of Care Note  Patient: Heather Conley  Procedure(s) Performed: CESAREAN SECTION (N/A )  Patient Location: PACU  Anesthesia Type:Spinal  Level of Consciousness: awake  Airway & Oxygen Therapy: Patient Spontanous Breathing  Post-op Assessment: Report given to RN  Post vital signs: Reviewed and stable  Last Vitals:  Vitals Value Taken Time  BP    Temp    Pulse    Resp    SpO2      Last Pain:  Vitals:   04/23/19 0709  TempSrc: Oral  PainSc:          Complications: No apparent anesthesia complications

## 2019-04-24 ENCOUNTER — Encounter: Payer: Self-pay | Admitting: *Deleted

## 2019-04-24 LAB — CBC
HCT: 32.6 % — ABNORMAL LOW (ref 36.0–46.0)
Hemoglobin: 10.3 g/dL — ABNORMAL LOW (ref 12.0–15.0)
MCH: 28 pg (ref 26.0–34.0)
MCHC: 31.6 g/dL (ref 30.0–36.0)
MCV: 88.6 fL (ref 80.0–100.0)
Platelets: 215 10*3/uL (ref 150–400)
RBC: 3.68 MIL/uL — ABNORMAL LOW (ref 3.87–5.11)
RDW: 15.5 % (ref 11.5–15.5)
WBC: 13.8 10*3/uL — ABNORMAL HIGH (ref 4.0–10.5)
nRBC: 0 % (ref 0.0–0.2)

## 2019-04-24 MED ORDER — MUSCLE RUB 10-15 % EX CREA
TOPICAL_CREAM | CUTANEOUS | Status: DC | PRN
  Filled 2019-04-24: qty 85

## 2019-04-24 MED ORDER — CYCLOBENZAPRINE HCL 5 MG PO TABS
5.0000 mg | ORAL_TABLET | Freq: Four times a day (QID) | ORAL | Status: DC
  Filled 2019-04-24 (×6): qty 1

## 2019-04-24 NOTE — Progress Notes (Signed)
Subjective: Postpartum Day 1: Cesarean Delivery - repeat in labor Patient reports tolerating PO and no problems voiding.    Objective: Vital signs in last 24 hours: Temp:  [97.4 F (36.3 C)-98.5 F (36.9 C)] 98.5 F (36.9 C) (01/29 0600) Pulse Rate:  [58-105] 58 (01/29 0600) Resp:  [16-22] 17 (01/29 0600) BP: (99-117)/(44-95) 105/52 (01/29 0600) SpO2:  [96 %-100 %] 96 % (01/29 0600)  Physical Exam:  General: alert, cooperative and appears stated age 39: appropriate Uterine Fundus: firm Incision: healing well DVT Evaluation: No evidence of DVT seen on physical exam.  Recent Labs    04/21/19 0845 04/24/19 0558  HGB 12.8 10.3*  HCT 39.6 32.6*    Assessment/Plan: Status post Cesarean section. Doing well postoperatively.  Continue current care. D/W patient female infant circumcision, risks/benefits reviewed. All questions answered.   Colin Benton Mozel Burdett 04/24/2019, 8:14 AM

## 2019-04-24 NOTE — Progress Notes (Signed)
Saw patient for complaint of right arm/neck shooting pain. She underwent Caesarean section yesterday under spinal and did have right sided paresthesia on initial needle insertion which resolved with needle redirection. The pain she is having now is much more upper extremity/neck/upper back although does radiate down her right side sometimes. She is not experiencing any weakness or persistent numbness of the arm. Explained that this is unlikely to be related to her spinal, however there is a small chance it could be an unusual presentation of a post-dural puncture headache (she is not experiencing any headache at this time). Advised warm compresses, gentle stretching, massage, NSAIDs, and possibly muscle relaxer such as Flexeril if something additional is needed. Pain will likely improve with time and rest. Advised her to follow up if she develops headaches or progressive numbness/weakness of the extremity.  Lidia Collum, MD  04/24/19 10:03 AM

## 2019-04-25 MED ORDER — IBUPROFEN 600 MG PO TABS: 600.0000 mg | ORAL_TABLET | Freq: Four times a day (QID) | ORAL | 0 refills | Status: DC | PRN

## 2019-04-25 MED ORDER — DOCUSATE SODIUM 100 MG PO CAPS: 100.0000 mg | ORAL_CAPSULE | Freq: Two times a day (BID) | ORAL | 2 refills | Status: DC

## 2019-04-25 MED ORDER — OXYCODONE HCL 5 MG PO TABS: 5.0000 mg | ORAL_TABLET | ORAL | 0 refills | Status: DC | PRN

## 2019-04-25 NOTE — Lactation Note (Signed)
This note was copied from a baby's chart. 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  Also has a hand pump - [place].  As LC entered the room , [place] finishing her assessment. Dad holding baby and  The baby woke up. LC offered to assess the latch after mom had latched the baby.  LC showed mom how to flip upper lip to increase flanged lips, and ease chin down as mom compressed the breast with increased swallows .  LC discussed the importance of obtaining depth consistently with each latch and how dad could help if needed.  Mom mentioned her nipples are alittle sore. LC recommended on the 1st breast prior to latch - breast massage, hand express, prepump if needed with her HAKKA to prime the milk ducts and elongate the nipple areola for a deeper latched and to counteract the baby's recessed chin.  LC instructed mom on the use of comfort gels after feedings , when warm rinse with warm water and place in the refrige , and switch to breast shells except when sleeping.  Discussed the importance of STS feedings until the baby is back to birth weight, gaining steadily, and can stay awake for majority of feeding.  Nutritive vs non - nutritive feeding patterns and the importance of watching the baby for hanging out latched.  LC reviewed the doc flow sheets with mom and dad.  Mom has the [facility] pamphlet with [facility] phone numbers.  [place] praised mom for her efforts breast feeding.  Mom and dad receptive to teaching and expressed their appreciation with [place] visit.    Maternal Data Has patient been taught Hand Expression?: Yes  Feeding Feeding Type: Breast Fed  LATCH Score Latch: Grasps breast easily, tongue down, lips flanged, rhythmical sucking.  Audible Swallowing: Spontaneous and intermittent  Type of Nipple:  Everted at rest and after stimulation  Comfort (Breast/Nipple): Filling, red/small blisters or bruises, mild/mod discomfort  Hold (Positioning): Assistance needed to correctly position infant at breast and maintain latch.  LATCH Score: 8  Interventions Interventions: Breast feeding basics reviewed;Assisted with latch;Skin to skin;Breast massage;Hand express;Breast compression;Adjust position;Support pillows;Position options;Shells  Lactation Tools Discussed/Used Tools: Shells Shell Type: Inverted Pump Review: Setup, frequency, and cleaning;Milk Storage Initiated by:: MAI Date initiated:: 04/25/19   Consult Status Consult Status: Complete Date: 04/25/19    Myer Haff 04/25/2019, 9:57 AM

## 2019-04-25 NOTE — Lactation Note (Signed)
This note was copied from a baby's chart. Lactation Consultation Note  Patient Name: Heather Conley [id number] Date: 04/25/2019 Reason for consult: Follow-up assessment;Term P2, 75 hour female infant, -4% weight loss. Per mom, infant is currently cluster feeding. LC entered room, mom was breastfeeding infant, infant was latched on mom's right breast in cradle hold position, swallows observed, infant breastfed for 28 minutes. Mom hand expressed afterwards and infant was given 7 mls of colostrum by spoon. RN changed a void diaper and infant appeared content and placed in basinet. Mom will continue to breastfeed infant according to hunger cues, on demand and will not exceed 3 hours without breastfeeding infant. Mom knows to call RN or LC if she needs assistance with latching infant at breast.   Maternal Data    Feeding Feeding Type: Breast Fed  LATCH Score Latch: Grasps breast easily, tongue down, lips flanged, rhythmical sucking.  Audible Swallowing: Spontaneous and intermittent  Type of Nipple: Everted at rest and after stimulation  Comfort (Breast/Nipple): Soft / non-tender  Hold (Positioning): No assistance needed to correctly position infant at breast.  LATCH Score: 10  Interventions Interventions: Expressed milk;Hand express;Skin to skin  Lactation Tools Discussed/Used     Consult Status Consult Status: Follow-up Date: 04/25/19 Follow-up type: In-patient    Vicente Serene 04/25/2019, 12:32 AM

## 2019-04-25 NOTE — Discharge Summary (Signed)
Obstetric Discharge Summary Reason for Admission: cesarean section Prenatal Procedures: none Intrapartum Procedures: cesarean: low cervical, transverse Postpartum Procedures: none Complications-Operative and Postpartum: none Hemoglobin  Date Value Ref Range Status  04/24/2019 10.3 (L) 12.0 - 15.0 g/dL Final   HGB  Date Value Ref Range Status  01/01/2012 13.4 12.0 - 16.0 g/dL Final   HCT  Date Value Ref Range Status  04/24/2019 32.6 (L) 36.0 - 46.0 % Final  01/01/2012 39.3 35.0 - 47.0 % Final    Physical Exam:  General: alert and cooperative Lochia: appropriate Uterine Fundus: firm Incision: healing well DVT Evaluation: No evidence of DVT seen on physical exam.  Discharge Diagnoses: Term Pregnancy-delivered  Discharge Information: Date: 04/25/2019 Activity: pelvic rest Diet: routine Medications: PNV, Ibuprofen, Colace and oxycodone Condition: stable Instructions: refer to practice specific booklet Discharge to: home   Newborn Data: Live born female  Birth Weight: 9 lb 6.8 oz (4275 g) APGAR: 8, 9  Newborn Delivery   Birth date/time: 04/23/2019 08:04:00 Delivery type: C-Section, Vacuum Assisted Trial of labor: No C-section categorization: Repeat      Home with mother.  Tyson Dense 04/25/2019, 8:49 AM

## 2019-04-27 LAB — SURGICAL PATHOLOGY

## 2019-04-27 MED FILL — oxyCODONE HCL 5 MG TABS: 5 | 3 days supply | Qty: 15 | Fill #0

## 2019-06-04 DIAGNOSIS — Z1389 Encounter for screening for other disorder: Secondary | ICD-10-CM | POA: Diagnosis not present

## 2019-06-04 MED FILL — NORLYDA 0.35 MG TABS: 0.35 | 28 days supply | Qty: 28 | Fill #0

## 2019-07-08 DIAGNOSIS — D225 Melanocytic nevi of trunk: Secondary | ICD-10-CM | POA: Diagnosis not present

## 2019-07-08 DIAGNOSIS — D1801 Hemangioma of skin and subcutaneous tissue: Secondary | ICD-10-CM | POA: Diagnosis not present

## 2019-07-08 DIAGNOSIS — L821 Other seborrheic keratosis: Secondary | ICD-10-CM | POA: Diagnosis not present

## 2019-08-26 ENCOUNTER — Encounter: Payer: 59 | Admitting: Physician Assistant

## 2019-09-02 ENCOUNTER — Encounter: Payer: 59 | Admitting: Physician Assistant

## 2019-09-11 ENCOUNTER — Encounter: Payer: 59 | Admitting: Physician Assistant

## 2019-09-16 ENCOUNTER — Encounter: Payer: 59 | Admitting: Physician Assistant

## 2019-09-16 ENCOUNTER — Ambulatory Visit (INDEPENDENT_AMBULATORY_CARE_PROVIDER_SITE_OTHER): Payer: 59 | Admitting: Physician Assistant

## 2019-09-16 ENCOUNTER — Other Ambulatory Visit: Payer: Self-pay

## 2019-09-16 ENCOUNTER — Encounter: Payer: Self-pay | Admitting: Physician Assistant

## 2019-09-16 VITALS — BP 118/70 | HR 79 | Temp 97.9°F | Resp 18 | Ht 62.0 in | Wt 161.8 lb

## 2019-09-16 DIAGNOSIS — Z20822 Contact with and (suspected) exposure to covid-19: Secondary | ICD-10-CM

## 2019-09-16 DIAGNOSIS — Z0000 Encounter for general adult medical examination without abnormal findings: Secondary | ICD-10-CM

## 2019-09-16 LAB — CBC WITH DIFFERENTIAL/PLATELET
Basophils Absolute: 0.1 10*3/uL (ref 0.0–0.1)
Basophils Relative: 1.2 % (ref 0.0–3.0)
Eosinophils Absolute: 0.2 10*3/uL (ref 0.0–0.7)
Eosinophils Relative: 3.5 % (ref 0.0–5.0)
HCT: 43.2 % (ref 36.0–46.0)
Hemoglobin: 14 g/dL (ref 12.0–15.0)
Lymphocytes Relative: 34.4 % (ref 12.0–46.0)
Lymphs Abs: 2.2 10*3/uL (ref 0.7–4.0)
MCHC: 32.5 g/dL (ref 30.0–36.0)
MCV: 85.9 fl (ref 78.0–100.0)
Monocytes Absolute: 0.6 10*3/uL (ref 0.1–1.0)
Monocytes Relative: 9.5 % (ref 3.0–12.0)
Neutro Abs: 3.4 10*3/uL (ref 1.4–7.7)
Neutrophils Relative %: 51.4 % (ref 43.0–77.0)
Platelets: 277 10*3/uL (ref 150.0–400.0)
RBC: 5.03 Mil/uL (ref 3.87–5.11)
RDW: 13.8 % (ref 11.5–15.5)
WBC: 6.5 10*3/uL (ref 4.0–10.5)

## 2019-09-16 LAB — COMPREHENSIVE METABOLIC PANEL
ALT: 10 U/L (ref 0–35)
AST: 13 U/L (ref 0–37)
Albumin: 4.7 g/dL (ref 3.5–5.2)
Alkaline Phosphatase: 69 U/L (ref 39–117)
BUN: 22 mg/dL (ref 6–23)
CO2: 28 mEq/L (ref 19–32)
Calcium: 9.3 mg/dL (ref 8.4–10.5)
Chloride: 108 mEq/L (ref 96–112)
Creatinine, Ser: 0.72 mg/dL (ref 0.40–1.20)
GFR: 91.09 mL/min (ref >60.00)
Glucose, Bld: 79 mg/dL (ref 70–99)
Potassium: 4.4 mEq/L (ref 3.5–5.1)
Sodium: 141 mEq/L (ref 135–145)
Total Bilirubin: 0.5 mg/dL (ref 0.2–1.2)
Total Protein: 6.9 g/dL (ref 6.0–8.3)

## 2019-09-16 LAB — TSH: TSH: 1.36 u[IU]/mL (ref 0.35–4.50)

## 2019-09-16 LAB — VITAMIN D 25 HYDROXY (VIT D DEFICIENCY, FRACTURES): VITD: 40.2 ng/mL (ref 30.00–100.00)

## 2019-09-16 LAB — LIPID PANEL
Cholesterol: 169 mg/dL (ref 0–200)
HDL: 64.5 mg/dL (ref >39.00)
LDL Cholesterol: 97 mg/dL (ref 0–99)
NonHDL: 104.27
Total CHOL/HDL Ratio: 3
Triglycerides: 38 mg/dL (ref 0.0–149.0)
VLDL: 7.6 mg/dL (ref 0.0–40.0)

## 2019-09-16 LAB — HEMOGLOBIN A1C: Hgb A1c MFr Bld: 5.5 % (ref 4.6–6.5)

## 2019-09-16 NOTE — Patient Instructions (Signed)
Please go to the lab for blood work.   Our office will call you with your results unless you have chosen to receive results via MyChart.  If your blood work is normal we will follow-up each year for physicals and as scheduled for chronic medical problems.  If anything is abnormal we will treat accordingly and get you in for a follow-up.  Will call you once we have antibody results as well. This can take longer to result.   Preventive Care 11-37 Years Old, Female Preventive care refers to visits with your health care provider and lifestyle choices that can promote health and wellness. This includes:  A yearly physical exam. This may also be called an annual well check.  Regular dental visits and eye exams.  Immunizations.  Screening for certain conditions.  Healthy lifestyle choices, such as eating a healthy diet, getting regular exercise, not using drugs or products that contain nicotine and tobacco, and limiting alcohol use. What can I expect for my preventive care visit? Physical exam Your health care provider will check your:  Height and weight. This may be used to calculate body mass index (BMI), which tells if you are at a healthy weight.  Heart rate and blood pressure.  Skin for abnormal spots. Counseling Your health care provider may ask you questions about your:  Alcohol, tobacco, and drug use.  Emotional well-being.  Home and relationship well-being.  Sexual activity.  Eating habits.  Work and work Statistician.  Method of birth control.  Menstrual cycle.  Pregnancy history. What immunizations do I need?  Influenza (flu) vaccine  This is recommended every year. Tetanus, diphtheria, and pertussis (Tdap) vaccine  You may need a Td booster every 10 years. Varicella (chickenpox) vaccine  You may need this if you have not been vaccinated. Human papillomavirus (HPV) vaccine  If recommended by your health care provider, you may need three doses over 6  months. Measles, mumps, and rubella (MMR) vaccine  You may need at least one dose of MMR. You may also need a second dose. Meningococcal conjugate (MenACWY) vaccine  One dose is recommended if you are age 39-21 years and a first-year college student living in a residence hall, or if you have one of several medical conditions. You may also need additional booster doses. Pneumococcal conjugate (PCV13) vaccine  You may need this if you have certain conditions and were not previously vaccinated. Pneumococcal polysaccharide (PPSV23) vaccine  You may need one or two doses if you smoke cigarettes or if you have certain conditions. Hepatitis A vaccine  You may need this if you have certain conditions or if you travel or work in places where you may be exposed to hepatitis A. Hepatitis B vaccine  You may need this if you have certain conditions or if you travel or work in places where you may be exposed to hepatitis B. Haemophilus influenzae type b (Hib) vaccine  You may need this if you have certain conditions. You may receive vaccines as individual doses or as more than one vaccine together in one shot (combination vaccines). Talk with your health care provider about the risks and benefits of combination vaccines. What tests do I need?  Blood tests  Lipid and cholesterol levels. These may be checked every 5 years starting at age 4.  Hepatitis C test.  Hepatitis B test. Screening  Diabetes screening. This is done by checking your blood sugar (glucose) after you have not eaten for a while (fasting).  Sexually transmitted disease (  STD) testing.  BRCA-related cancer screening. This may be done if you have a family history of breast, ovarian, tubal, or peritoneal cancers.  Pelvic exam and Pap test. This may be done every 3 years starting at age 51. Starting at age 36, this may be done every 5 years if you have a Pap test in combination with an HPV test. Talk with your health care  provider about your test results, treatment options, and if necessary, the need for more tests. Follow these instructions at home: Eating and drinking   Eat a diet that includes fresh fruits and vegetables, whole grains, lean protein, and low-fat dairy.  Take vitamin and mineral supplements as recommended by your health care provider.  Do not drink alcohol if: ? Your health care provider tells you not to drink. ? You are pregnant, may be pregnant, or are planning to become pregnant.  If you drink alcohol: ? Limit how much you have to 0-1 drink a day. ? Be aware of how much alcohol is in your drink. In the [place] one drink equals one 12 oz bottle of beer (355 mL), one 5 oz glass of wine (148 mL), or one 1 oz glass of hard liquor (44 mL). Lifestyle  Take daily care of your teeth and gums.  Stay active. Exercise for at least 30 minutes on 5 or more days each week.  Do not use any products that contain nicotine or tobacco, such as cigarettes, e-cigarettes, and chewing tobacco. If you need help quitting, ask your health care provider.  If you are sexually active, practice safe sex. Use a condom or other form of birth control (contraception) in order to prevent pregnancy and STIs (sexually transmitted infections). If you plan to become pregnant, see your health care provider for a preconception visit. What's next?  Visit your health care provider once a year for a well check visit.  Ask your health care provider how often you should have your eyes and teeth checked.  Stay up to date on all vaccines. This information is not intended to replace advice given to you by your health care provider. Make sure you discuss any questions you have with your health care provider. Document Revised: 11/21/2017 Document Reviewed: 11/21/2017 Elsevier Patient Education  2020 [employer].

## 2019-09-16 NOTE — Progress Notes (Signed)
Patient presents to clinic today for annual exam.  Patient is fasting for labs.  Patient endorses doing well overall. Has a 9 month old whom she is still breastfeeding. Is trying to keep well-hydrated. Keeping a well-balanced diet. Very active at work and is also doing cardio at least twice weekly. Mood stable.   Patient notes multiple prior exposures to COVID, most recently 8 weeks ago when her husband got sick. She never had symptoms and antigen tests were negative at that time. Would like an antibody test.   Health Maintenance: Immunizations -- Deferring her COVID vaccine until she is done breastfeeding.  PAP -- UTD  Past Medical History:  Diagnosis Date  . Complication of anesthesia    itching post epidural  . GAD (generalized anxiety disorder)    zoloft in the past helped but sexual side effects caused her to d/c it.  . Gastritis Mar/Apr 2014  . GERD (gastroesophageal reflux disease)    chronic cough--eval by Dr. Shoemaker-ENT-07/22/13.  . Microscopic hematuria    Exam, labs, cysto, and imaging all UNREMARKABLE with Dr. Tresa Moore all NEG--pt to get further eval only if gross hematuria occurs.  . 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 CESAREAN SECTION N/A 07/06/2016   Procedure: CESAREAN SECTION;  Surgeon: Tyson Dense, MD;  Location: [place] [facility];  Service: Obstetrics;  Laterality: N/A;  . CESAREAN SECTION N/A 04/23/2019   Procedure: CESAREAN SECTION;  Surgeon: Linda Hedges, DO;  Location: [facility];  Service: Obstetrics;  Laterality: N/A;  Repeat  allergy to morphine, sulfa and procardia edc 04/30/19 Heather, RNFA  . WISDOM TOOTH EXTRACTION  approx 2956   No complications    Current Outpatient Medications on File Prior to Visit  Medication Sig Dispense Refill  . Prenatal Vit-Fe Fumarate-FA (PRENATAL MULTIVITAMIN) TABS tablet Take 1 tablet by mouth daily at 12 noon.     No current  facility-administered medications on file prior to visit.    Allergies  Allergen Reactions  . Morphine And Related Itching  . Sulfa Antibiotics     Pt states that both mother and father are allergic, she she thinks that she might be.  . Procardia [Nifedipine] Palpitations    Family History  Problem Relation Age of Onset  . Alcohol abuse Mother   . Heart disease Maternal Uncle 70  . Heart disease Maternal Grandfather   . Stroke Paternal Grandmother     Social History   Socioeconomic History  . Marital status: Married    Spouse name: [place]  . Number of children: 1  . Years of education: Not on file  . Highest education level: Not on file  Occupational History  . Occupation: Nurse    Employer: [employer]    Comment: Cath Lab  Tobacco Use  . Smoking status: Never Smoker  . Smokeless tobacco: Never Used  Vaping Use  . Vaping Use: Never used  Substance and Sexual Activity  . Alcohol use: Yes    Comment: social  . Drug use: No  . Sexual activity: Yes  Other Topics Concern  . Not on file  Social History Narrative   Married, one daughter born 2018.  Lives in [place].   Orig from [place].     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   No tobacco, occas alcohol, no drugs.   Exercises 3-5 days per week.  Normal diet.      Social Determinants of Health   Financial Resource Strain:   . Difficulty of Paying Living Expenses:   Food Insecurity:   . Worried About Charity fundraiser in the Last Year:   . Arboriculturist in the Last Year:   Transportation Needs:   . Film/video editor (Medical):   Marland Kitchen Lack of Transportation (Non-Medical):   Physical Activity:   . Days of Exercise per Week:   . Minutes of Exercise per Session:   Stress:   . Feeling of Stress :   Social Connections:   . Frequency of Communication with Friends and Family:   . Frequency of Social Gatherings with Friends and Family:   . Attends Religious Services:   . Active  Member of Clubs or Organizations:   . Attends Archivist Meetings:   Marland Kitchen Marital Status:   Intimate Partner Violence:   . Fear of Current or Ex-Partner:   . Emotionally Abused:   Marland Kitchen Physically Abused:   . Sexually Abused:    Review of Systems  Constitutional: Negative for fever and weight loss.  HENT: Negative for ear discharge, ear pain, hearing loss and tinnitus.   Eyes: Negative for blurred vision, double vision, photophobia and pain.  Respiratory: Negative for cough and shortness of breath.   Cardiovascular: Negative for chest pain and palpitations.  Gastrointestinal: Negative for abdominal pain, blood in stool, constipation, diarrhea, heartburn, melena, nausea and vomiting.  Genitourinary: Negative for dysuria, flank pain, frequency, hematuria and urgency.  Musculoskeletal: Negative for falls.  Neurological: Negative for dizziness, loss of consciousness and headaches.  Endo/Heme/Allergies: Negative for environmental allergies.  Psychiatric/Behavioral: Negative for depression, hallucinations, substance abuse and suicidal ideas. The patient is not nervous/anxious and does not have insomnia.    BP 118/70   Pulse 79   Temp 97.9 F (36.6 C) (Temporal)   Resp 18   Ht 5\' 2"  (1.575 m)   Wt 161 lb 12.8 oz (73.4 kg)   SpO2 98%   BMI 29.59 kg/m   Physical Exam Vitals reviewed.  HENT:     Head: Normocephalic and atraumatic.     Right Ear: Tympanic membrane, ear canal and external ear normal.     Left Ear: Tympanic membrane, ear canal and external ear normal.     Nose: Nose normal. No mucosal edema.     Mouth/Throat:     Pharynx: Uvula midline. No oropharyngeal exudate or posterior oropharyngeal erythema.  Eyes:     Conjunctiva/sclera: Conjunctivae normal.     Pupils: Pupils are equal, round, and reactive to light.  Neck:     Thyroid: No thyromegaly.  Cardiovascular:     Rate and Rhythm: Normal rate and regular rhythm.     Heart sounds: Normal heart sounds.    Pulmonary:     Effort: Pulmonary effort is normal. No respiratory distress.     Breath sounds: Normal breath sounds. No wheezing or rales.  Abdominal:     General: Bowel sounds are normal. There is no distension.     Palpations: Abdomen is soft. There is no mass.     Tenderness: There is no abdominal tenderness. There is no guarding or rebound.  Musculoskeletal:     Cervical back: Neck supple.  Lymphadenopathy:     Cervical: No cervical adenopathy.  Skin:    General: Skin is warm and dry.     Findings: No rash.  Neurological:     Mental Status: She is  alert and oriented to person, place, and time.     Cranial Nerves: No cranial nerve deficit.    Assessment/Plan: 1. Visit for preventive health examination Depression screen negative. Health Maintenance reviewed. Preventive schedule discussed and handout given in AVS. Will obtain fasting labs today.  - CBC with Differential/Platelet - Comprehensive metabolic panel - Lipid panel - Hemoglobin A1c - TSH - Vitamin D (25 hydroxy)  2. Exposure to COVID-19 virus Discussed limited use of this test with patient. She would like to proceed. Order placed -- will be sent out to [facility]. Patient aware.  - SARS-CoV-2 Antibody, IgM  This visit occurred during the SARS-CoV-2 public health emergency.  Safety protocols were in place, including screening questions prior to the visit, additional usage of staff PPE, and extensive cleaning of exam room while observing appropriate contact time as indicated for disinfecting solutions.     Leeanne Rio, PA-C

## 2019-09-17 LAB — SARS-COV-2 ANTIBODY, IGM: SARS-CoV-2 Antibody, IgM: NEGATIVE

## 2019-11-10 ENCOUNTER — Ambulatory Visit: Payer: 59 | Attending: Internal Medicine

## 2019-11-10 DIAGNOSIS — Z23 Encounter for immunization: Secondary | ICD-10-CM

## 2019-11-10 NOTE — Progress Notes (Signed)
   Covid-19 Vaccination Clinic  Name:  Heather Conley    MRN: [id number] DOB: 12-16-1980  11/10/2019  [name] was observed post Covid-19 immunization for 15 minutes without incident. She was provided with Vaccine Information Sheet and instruction to access the V-Safe system.   [name] was instructed to call 911 with any severe reactions post vaccine: Marland Kitchen Difficulty breathing  . Swelling of face and throat  . A fast heartbeat  . A bad rash all over body  . Dizziness and weakness   Immunizations Administered    Name Date Dose VIS Date Route   Pfizer COVID-19 Vaccine 11/10/2019  3:41 PM 0.3 mL 05/20/2018 Intramuscular   Manufacturer: [employer]   Lot: [id number]   [place]: [address]-4

## 2019-12-01 ENCOUNTER — Ambulatory Visit: Payer: 59 | Attending: Internal Medicine

## 2019-12-01 DIAGNOSIS — Z23 Encounter for immunization: Secondary | ICD-10-CM

## 2019-12-01 NOTE — Progress Notes (Signed)
   Covid-19 Vaccination Clinic  Name:  Heather Conley    MRN: [id number] DOB: Nov 03, 1980  12/01/2019  Heather Conley was observed post Covid-19 immunization for 15 minutes without incident. She was provided with Vaccine Information Sheet and instruction to access the V-Safe system.   Heather Conley was instructed to call 911 with any severe reactions post vaccine: Marland Kitchen Difficulty breathing  . Swelling of face and throat  . A fast heartbeat  . A bad rash all over body  . Dizziness and weakness   Immunizations Administered    Name Date Dose VIS Date Route   Pfizer COVID-19 Vaccine 12/01/2019  3:59 PM 0.3 mL 05/20/2018 Intramuscular   Manufacturer: [place]   Lot: [id number]   [place]: [id number]

## 2019-12-02 DIAGNOSIS — H5213 Myopia, bilateral: Secondary | ICD-10-CM | POA: Diagnosis not present

## 2019-12-08 ENCOUNTER — Ambulatory Visit: Payer: Self-pay

## 2020-04-20 ENCOUNTER — Ambulatory Visit: Payer: 59 | Admitting: Physician Assistant

## 2020-05-02 DIAGNOSIS — R102 Pelvic and perineal pain: Secondary | ICD-10-CM | POA: Diagnosis not present

## 2020-05-11 ENCOUNTER — Telehealth: Payer: 59 | Admitting: Family Medicine

## 2020-05-12 ENCOUNTER — Other Ambulatory Visit (HOSPITAL_COMMUNITY): Payer: Self-pay | Admitting: Gastroenterology

## 2020-05-12 DIAGNOSIS — R1011 Right upper quadrant pain: Secondary | ICD-10-CM

## 2020-05-12 DIAGNOSIS — K219 Gastro-esophageal reflux disease without esophagitis: Secondary | ICD-10-CM | POA: Diagnosis not present

## 2020-05-12 DIAGNOSIS — R110 Nausea: Secondary | ICD-10-CM | POA: Diagnosis not present

## 2020-05-12 DIAGNOSIS — K5900 Constipation, unspecified: Secondary | ICD-10-CM | POA: Diagnosis not present

## 2020-05-17 ENCOUNTER — Other Ambulatory Visit: Payer: Self-pay

## 2020-05-17 ENCOUNTER — Encounter (HOSPITAL_COMMUNITY): Payer: Self-pay

## 2020-05-17 ENCOUNTER — Ambulatory Visit (HOSPITAL_COMMUNITY)
Admission: RE | Admit: 2020-05-17 | Discharge: 2020-05-17 | Disposition: A | Discharge status: Home / Self Care | Payer: 59 | Source: Ambulatory Visit | Attending: Gastroenterology | Admitting: Gastroenterology

## 2020-05-17 DIAGNOSIS — R1011 Right upper quadrant pain: Secondary | ICD-10-CM | POA: Insufficient documentation

## 2020-06-28 ENCOUNTER — Encounter (HOSPITAL_COMMUNITY): Payer: 59

## 2020-09-01 ENCOUNTER — Other Ambulatory Visit (HOSPITAL_COMMUNITY): Payer: Self-pay | Admitting: Gastroenterology

## 2020-09-01 DIAGNOSIS — R1033 Periumbilical pain: Secondary | ICD-10-CM | POA: Diagnosis not present

## 2020-09-01 DIAGNOSIS — R1011 Right upper quadrant pain: Secondary | ICD-10-CM | POA: Diagnosis not present

## 2020-09-01 DIAGNOSIS — R110 Nausea: Secondary | ICD-10-CM | POA: Diagnosis not present

## 2020-09-18 DIAGNOSIS — H1031 Unspecified acute conjunctivitis, right eye: Secondary | ICD-10-CM | POA: Diagnosis not present

## 2020-09-19 ENCOUNTER — Ambulatory Visit (HOSPITAL_COMMUNITY)
Admission: RE | Admit: 2020-09-19 | Discharge: 2020-09-19 | Disposition: A | Discharge status: Home / Self Care | Payer: 59 | Source: Ambulatory Visit | Attending: Gastroenterology | Admitting: Gastroenterology

## 2020-09-19 ENCOUNTER — Other Ambulatory Visit: Payer: Self-pay

## 2020-09-19 DIAGNOSIS — R1011 Right upper quadrant pain: Secondary | ICD-10-CM | POA: Insufficient documentation

## 2020-09-21 ENCOUNTER — Other Ambulatory Visit (HOSPITAL_COMMUNITY): Payer: 59

## 2020-09-29 ENCOUNTER — Ambulatory Visit (HOSPITAL_COMMUNITY): Payer: 59

## 2020-10-07 DIAGNOSIS — Z20822 Contact with and (suspected) exposure to covid-19: Secondary | ICD-10-CM | POA: Diagnosis not present

## 2020-10-07 DIAGNOSIS — J019 Acute sinusitis, unspecified: Secondary | ICD-10-CM | POA: Diagnosis not present

## 2020-10-10 ENCOUNTER — Encounter (HOSPITAL_COMMUNITY): Payer: Self-pay

## 2020-10-10 ENCOUNTER — Ambulatory Visit (HOSPITAL_COMMUNITY): Payer: 59

## 2020-10-18 ENCOUNTER — Encounter: Payer: Self-pay | Admitting: Family Medicine

## 2020-10-18 ENCOUNTER — Telehealth (INDEPENDENT_AMBULATORY_CARE_PROVIDER_SITE_OTHER): Payer: 59 | Admitting: Family Medicine

## 2020-10-18 DIAGNOSIS — H9209 Otalgia, unspecified ear: Secondary | ICD-10-CM | POA: Diagnosis not present

## 2020-10-18 DIAGNOSIS — U071 COVID-19: Secondary | ICD-10-CM | POA: Diagnosis not present

## 2020-10-18 DIAGNOSIS — R0981 Nasal congestion: Secondary | ICD-10-CM

## 2020-10-18 DIAGNOSIS — R519 Headache, unspecified: Secondary | ICD-10-CM

## 2020-10-18 DIAGNOSIS — R059 Cough, unspecified: Secondary | ICD-10-CM

## 2020-10-18 MED ORDER — BENZONATATE 100 MG PO CAPS: 200.0000 mg | ORAL_CAPSULE | Freq: Three times a day (TID) | ORAL | 0 refills | Status: DC | PRN

## 2020-10-18 NOTE — Patient Instructions (Addendum)
  HOME CARE TIPS:    -I sent the medication(s) we discussed to your pharmacy: Meds ordered this encounter  Medications   benzonatate (TESSALON PERLES) 100 MG capsule    Sig: Take 2 capsules (200 mg total) by mouth 3 (three) times daily as needed.    Dispense:  20 capsule    Refill:  0    -continue the Augmentin  -can use tylenol if needed for fevers, aches and pains per instructions  -can use nasal saline a few times per day if you have nasal congestion; sometimes  a short course of Afrin nasal spray for 3 days can help with symptoms as well  -stay hydrated, drink plenty of fluids and eat small healthy meals - avoid dairy  -follow up with your doctor in 2-3 days unless improving and feeling better  -stay home while sick if you can :) Wear a good mask that fits snugly (such as N95 or KN95) if around others to reduce the risk of transmission.  It was nice to meet you today, and I really hope you are feeling better soon. I help [facility] out with telemedicine visits on Tuesdays and Thursdays and am available for visits on those days. If you have any concerns or questions following this visit please schedule a follow up visit with your Primary Care doctor or seek care at a local urgent care clinic to avoid delays in care.    Seek in person care or schedule a follow up video visit promptly if your symptoms worsen, new concerns arise or you are not improving with treatment. Call 911 and/or seek emergency care if your symptoms are severe or life threatening.

## 2020-10-18 NOTE — Progress Notes (Signed)
Virtual Visit via Video Note  I connected with Heather Conley  on 10/18/20 at  5:40 PM EDT by a video enabled telemedicine application and verified that I am speaking with the correct person using two identifiers.  Location patient: home, [place] Location provider:work or home office Persons participating in the virtual visit: patient, provider  I discussed the limitations of evaluation and management by telemedicine and the availability of in person appointments. The patient expressed understanding and agreed to proceed.   HPI:  Acute telemedicine visit for cough: -Onset: she has been sick for a few weeks with sinus issues since the 9th, had covid on July 19 -symptoms currently include fatigue, some ear discomfort on the R for a few days, nasal congestion, sinus discomfort on L maxillary for over 10 days, thick sinus discharge - improving with the Augmentin provided at [facility] (on day 2), ongoing cough -entire family has been sick with covid and she has not been getting a sleep -Denies:fevers, CP, SOB, NVD, severe/worst HA of her life, inability to eat/drink/get out of bed -Pertinent past medical history: see below -Pertinent medication allergies: Allergies  Allergen Reactions   Morphine And Related Itching   Sulfa Antibiotics     Pt states that both mother and father are allergic, she she thinks that she might be.   Procardia [Nifedipine] Palpitations  -COVID-19 vaccine status: vaccinated x2 -denies any chance of pregnancy  ROS: See pertinent positives and negatives per HPI.  Past Medical History:  Diagnosis Date   Complication of anesthesia    itching post epidural   GAD (generalized anxiety disorder)    zoloft in the past helped but sexual side effects caused her to d/c it.   Gastritis Mar/Apr 2014   GERD (gastroesophageal reflux disease)    chronic cough--eval by Dr. Shoemaker-ENT-07/22/13.   Microscopic hematuria    Exam, labs, cysto, and imaging all UNREMARKABLE with Dr. Tresa Moore all  NEG--pt to get further eval only if gross hematuria occurs.   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   Eval by ENT 06/2013    Past Surgical History:  Procedure Laterality Date   CESAREAN SECTION N/A 07/06/2016   Procedure: CESAREAN SECTION;  Surgeon: Tyson Dense, MD;  Location: [facility];  Service: Obstetrics;  Laterality: N/A;   CESAREAN SECTION N/A 04/23/2019   Procedure: CESAREAN SECTION;  Surgeon: Linda Hedges, DO;  Location: [facility];  Service: Obstetrics;  Laterality: N/A;  Repeat  allergy to morphine, sulfa and procardia edc 04/30/19 Heather, RNFA   WISDOM TOOTH EXTRACTION  approx [id number]   No complications     Current Outpatient Medications:    amoxicillin-clavulanate (AUGMENTIN) 875-125 MG tablet, SMARTSIG:1 Tablet(s) By Mouth Every 12 Hours, Disp: , Rfl:    benzonatate (TESSALON PERLES) 100 MG capsule, Take 2 capsules (200 mg total) by mouth 3 (three) times daily as needed., Disp: 20 capsule, Rfl: 0   Prenatal Vit-Fe Fumarate-FA (PRENATAL MULTIVITAMIN) TABS tablet, Take 1 tablet by mouth daily at 12 noon., Disp: , Rfl:   EXAM:  VITALS per patient if applicable:  GENERAL: alert, oriented, appears well and in no acute distress  HEENT: atraumatic, conjunttiva clear, no obvious abnormalities on inspection of external nose and ears  NECK: normal movements of the head and neck  LUNGS: on inspection no signs of respiratory distress, breathing rate appears normal, no obvious gross SOB, gasping or wheezing  CV: no obvious cyanosis  MS: moves all visible extremities without noticeable abnormality  PSYCH/NEURO: pleasant and cooperative, no obvious depression or  anxiety, speech and thought processing grossly intact  ASSESSMENT AND PLAN:  Discussed the following assessment and plan:  Nasal congestion  Cough  Facial discomfort  Discomfort of ear, unspecified laterality  COVID-19  -we discussed possible serious and likely etiologies, options for evaluation and workup,  limitations of telemedicine visit vs in person visit, treatment, treatment risks and precautions. Pt prefers to treat via telemedicine empirically rather than in person at this moment.  Query sinusitis, vs covid symptoms, possible OM vs other. It seems she was dealing with sinus issues before getting covid and developed symptoms c/w sinus infection. She started augmentin 2 days ago - advised to continue, add nasal saline twice daily, Tessalon for cough. Advised to stay home while feeling sick unless to seek care as is still within 10 days from the covid diagnoses. Work/School slipped offered:  declined Advised to seek prompt in person care if worsening, new symptoms arise, or if is not improving with treatment. Discussed options for inperson care if PCP office not available. Did let this patient know that I only do telemedicine on Tuesdays and Thursdays for [place]. Advised to schedule follow up visit with PCP or [facility] if any further questions or concerns to avoid delays in care.   I discussed the assessment and treatment plan with the patient. The patient was provided an opportunity to ask questions and all were answered. The patient agreed with the plan and demonstrated an understanding of the instructions.     Lucretia Kern, DO

## 2020-10-20 DIAGNOSIS — R319 Hematuria, unspecified: Secondary | ICD-10-CM | POA: Diagnosis not present

## 2020-10-20 DIAGNOSIS — Z319 Encounter for procreative management, unspecified: Secondary | ICD-10-CM | POA: Diagnosis not present

## 2020-10-20 DIAGNOSIS — Z01419 Encounter for gynecological examination (general) (routine) without abnormal findings: Secondary | ICD-10-CM | POA: Diagnosis not present

## 2020-10-20 DIAGNOSIS — Z6826 Body mass index (BMI) 26.0-26.9, adult: Secondary | ICD-10-CM | POA: Diagnosis not present

## 2020-10-27 ENCOUNTER — Ambulatory Visit (HOSPITAL_COMMUNITY)
Admission: RE | Admit: 2020-10-27 | Discharge: 2020-10-27 | Disposition: A | Discharge status: Home / Self Care | Payer: 59 | Source: Ambulatory Visit | Attending: Gastroenterology | Admitting: Gastroenterology

## 2020-10-27 ENCOUNTER — Other Ambulatory Visit: Payer: Self-pay

## 2020-10-27 DIAGNOSIS — R1011 Right upper quadrant pain: Secondary | ICD-10-CM | POA: Diagnosis not present

## 2020-10-27 MED ORDER — TECHNETIUM TC 99M MEBROFENIN IV KIT
5.3000 | PACK | Freq: Once | INTRAVENOUS | Status: AC | PRN
  Administered 2020-10-27: 5.3 via INTRAVENOUS

## 2020-12-13 ENCOUNTER — Other Ambulatory Visit (HOSPITAL_BASED_OUTPATIENT_CLINIC_OR_DEPARTMENT_OTHER): Payer: Self-pay

## 2020-12-13 ENCOUNTER — Other Ambulatory Visit (HOSPITAL_BASED_OUTPATIENT_CLINIC_OR_DEPARTMENT_OTHER): Payer: Self-pay | Admitting: Primary Care

## 2020-12-13 DIAGNOSIS — R1084 Generalized abdominal pain: Secondary | ICD-10-CM

## 2020-12-14 ENCOUNTER — Encounter (HOSPITAL_BASED_OUTPATIENT_CLINIC_OR_DEPARTMENT_OTHER): Payer: Self-pay

## 2020-12-14 ENCOUNTER — Ambulatory Visit (HOSPITAL_BASED_OUTPATIENT_CLINIC_OR_DEPARTMENT_OTHER)
Admission: RE | Admit: 2020-12-14 | Discharge: 2020-12-14 | Disposition: A | Discharge status: Home / Self Care | Payer: 59 | Source: Ambulatory Visit | Attending: Primary Care | Admitting: Primary Care

## 2020-12-14 ENCOUNTER — Other Ambulatory Visit: Payer: Self-pay

## 2020-12-14 DIAGNOSIS — R1084 Generalized abdominal pain: Secondary | ICD-10-CM | POA: Insufficient documentation

## 2020-12-14 DIAGNOSIS — R109 Unspecified abdominal pain: Secondary | ICD-10-CM | POA: Diagnosis not present

## 2020-12-14 MED ORDER — IOHEXOL 350 MG/ML SOLN
100.0000 mL | Freq: Once | INTRAVENOUS | Status: AC | PRN
  Administered 2020-12-14: 85 mL via INTRAVENOUS

## 2020-12-22 DIAGNOSIS — Z03818 Encounter for observation for suspected exposure to other biological agents ruled out: Secondary | ICD-10-CM | POA: Diagnosis not present

## 2020-12-22 DIAGNOSIS — A084 Viral intestinal infection, unspecified: Secondary | ICD-10-CM | POA: Diagnosis not present

## 2020-12-22 DIAGNOSIS — J00 Acute nasopharyngitis [common cold]: Secondary | ICD-10-CM | POA: Diagnosis not present

## 2021-02-22 DIAGNOSIS — R351 Nocturia: Secondary | ICD-10-CM | POA: Diagnosis not present

## 2021-02-22 DIAGNOSIS — R350 Frequency of micturition: Secondary | ICD-10-CM | POA: Diagnosis not present

## 2021-02-22 DIAGNOSIS — R3121 Asymptomatic microscopic hematuria: Secondary | ICD-10-CM | POA: Diagnosis not present

## 2021-04-14 DIAGNOSIS — H5213 Myopia, bilateral: Secondary | ICD-10-CM | POA: Diagnosis not present

## 2021-04-14 DIAGNOSIS — H52221 Regular astigmatism, right eye: Secondary | ICD-10-CM | POA: Diagnosis not present

## 2021-08-16 ENCOUNTER — Other Ambulatory Visit: Payer: Self-pay

## 2021-08-16 ENCOUNTER — Encounter: Payer: Self-pay | Admitting: Registered Nurse

## 2021-08-16 ENCOUNTER — Ambulatory Visit: Payer: 59 | Admitting: Registered Nurse

## 2021-08-16 VITALS — BP 125/103 | HR 79 | Temp 98.0°F | Resp 18 | Ht 62.0 in | Wt 148.0 lb

## 2021-08-16 DIAGNOSIS — Z13220 Encounter for screening for lipoid disorders: Secondary | ICD-10-CM

## 2021-08-16 DIAGNOSIS — Z1159 Encounter for screening for other viral diseases: Secondary | ICD-10-CM | POA: Diagnosis not present

## 2021-08-16 DIAGNOSIS — Z130 Encounter for screening for diseases of the blood and blood-forming organs and certain disorders involving the immune mechanism: Secondary | ICD-10-CM

## 2021-08-16 DIAGNOSIS — R5382 Chronic fatigue, unspecified: Secondary | ICD-10-CM | POA: Diagnosis not present

## 2021-08-16 DIAGNOSIS — Z1329 Encounter for screening for other suspected endocrine disorder: Secondary | ICD-10-CM

## 2021-08-16 DIAGNOSIS — Z13228 Encounter for screening for other metabolic disorders: Secondary | ICD-10-CM

## 2021-08-16 DIAGNOSIS — Z0000 Encounter for general adult medical examination without abnormal findings: Secondary | ICD-10-CM | POA: Diagnosis not present

## 2021-08-16 LAB — CBC WITH DIFFERENTIAL/PLATELET
Basophils Absolute: 0.1 10*3/uL (ref 0.0–0.1)
Basophils Relative: 1.4 % (ref 0.0–3.0)
Eosinophils Absolute: 0.2 10*3/uL (ref 0.0–0.7)
Eosinophils Relative: 3.6 % (ref 0.0–5.0)
HCT: 42.4 % (ref 36.0–46.0)
Hemoglobin: 13.8 g/dL (ref 12.0–15.0)
Lymphocytes Relative: 32.6 % (ref 12.0–46.0)
Lymphs Abs: 1.7 10*3/uL (ref 0.7–4.0)
MCHC: 32.6 g/dL (ref 30.0–36.0)
MCV: 87.3 fl (ref 78.0–100.0)
Monocytes Absolute: 0.6 10*3/uL (ref 0.1–1.0)
Monocytes Relative: 10.6 % (ref 3.0–12.0)
Neutro Abs: 2.7 10*3/uL (ref 1.4–7.7)
Neutrophils Relative %: 51.8 % (ref 43.0–77.0)
Platelets: 252 10*3/uL (ref 150.0–400.0)
RBC: 4.86 Mil/uL (ref 3.87–5.11)
RDW: 14.2 % (ref 11.5–15.5)
WBC: 5.2 10*3/uL (ref 4.0–10.5)

## 2021-08-16 LAB — COMPREHENSIVE METABOLIC PANEL
ALT: 9 U/L (ref 0–35)
AST: 12 U/L (ref 0–37)
Albumin: 4.7 g/dL (ref 3.5–5.2)
Alkaline Phosphatase: 41 U/L (ref 39–117)
BUN: 17 mg/dL (ref 6–23)
CO2: 27 mEq/L (ref 19–32)
Calcium: 9.3 mg/dL (ref 8.4–10.5)
Chloride: 105 mEq/L (ref 96–112)
Creatinine, Ser: 0.73 mg/dL (ref 0.40–1.20)
GFR: 103.88 mL/min (ref >60.00)
Glucose, Bld: 79 mg/dL (ref 70–99)
Potassium: 4.4 mEq/L (ref 3.5–5.1)
Sodium: 141 mEq/L (ref 135–145)
Total Bilirubin: 0.5 mg/dL (ref 0.2–1.2)
Total Protein: 7 g/dL (ref 6.0–8.3)

## 2021-08-16 LAB — LIPID PANEL
Cholesterol: 183 mg/dL (ref 0–200)
HDL: 76.1 mg/dL (ref >39.00)
LDL Cholesterol: 92 mg/dL (ref 0–99)
NonHDL: 106.48
Total CHOL/HDL Ratio: 2
Triglycerides: 72 mg/dL (ref 0.0–149.0)
VLDL: 14.4 mg/dL (ref 0.0–40.0)

## 2021-08-16 LAB — TSH: TSH: 1.72 u[IU]/mL (ref 0.35–5.50)

## 2021-08-16 LAB — HEMOGLOBIN A1C: Hgb A1c MFr Bld: 5.2 % (ref 4.6–6.5)

## 2021-08-16 LAB — B12 AND FOLATE PANEL
Folate: 23.2 ng/mL (ref >5.9)
Vitamin B-12: 168 pg/mL — ABNORMAL LOW (ref 211–911)

## 2021-08-16 LAB — VITAMIN D 25 HYDROXY (VIT D DEFICIENCY, FRACTURES): VITD: 33.12 ng/mL (ref 30.00–100.00)

## 2021-08-16 NOTE — Progress Notes (Signed)
Complete physical exam  Patient: Heather Conley   DOB: Feb 05, 1981   41 y.o. Female  MRN: [id number] Visit Date: 08/16/2021  Subjective:    Chief Complaint  Patient presents with   Transitions Of Care    Patient states she is here for a TOC and CPE    [name] is a 41 y.o. female who presents today for a complete physical exam. She reports consuming a general diet. Home exercise routine includes cardio. She generally feels well. She reports sleeping well. She does have additional problems to discuss today.   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 Her 39 year old has a lot of allergies. Doesn't sleep well, she stays awake with him. Hx of vit d and vit b12 deficiency Hx of GAD but has been off of SSRIs for a few years.   Most recent fall risk assessment:    08/16/2021    8:43 AM  Fall Risk   Falls in the past year? 0  Number falls in past yr: 0  Injury with Fall? 0  Risk for fall due to : No Fall Risks  Follow up Falls evaluation completed     Most recent depression screenings:    09/16/2019    9:50 AM 12/04/2017    8:12 AM  PHQ 2/9 Scores  PHQ - 2 Score 0 0  PHQ- 9 Score  0     Patient Active Problem List   Diagnosis Date Noted   Previous cesarean section 04/23/2019   S/P cesarean section 04/23/2019   Visit for preventive health examination 12/04/2017   Fatigue 12/04/2017   Seasonal allergies 09/29/2015   Allergic rhinitis 07/13/2013   Mild persistent asthma 07/13/2013   Eustachian tube dysfunction 03/03/2013   Microhematuria 08/12/2012   GAD (generalized anxiety disorder) 04/04/2012   Past Medical History:  Diagnosis Date   Complication of anesthesia    itching post epidural   GAD (generalized anxiety disorder)    zoloft in the past helped but sexual side effects caused her to d/c it.   Gastritis Mar/Apr 2014   GERD (gastroesophageal reflux disease)     chronic cough--eval by Dr. Shoemaker-ENT-07/22/13.   Microscopic hematuria    Exam, labs, cysto, and imaging all UNREMARKABLE with Dr. Tresa Moore all NEG--pt to get further eval only if gross hematuria occurs.   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   Eval by ENT 06/2013   Past Surgical History:  Procedure Laterality Date   CESAREAN SECTION N/A 07/06/2016   Procedure: CESAREAN SECTION;  Surgeon: Tyson Dense, MD;  Location: [facility];  Service: Obstetrics;  Laterality: N/A;   CESAREAN SECTION N/A 04/23/2019   Procedure: CESAREAN SECTION;  Surgeon: Linda Hedges, DO;  Location: [facility];  Service: Obstetrics;  Laterality: N/A;  Repeat  allergy to morphine, sulfa and procardia edc 04/30/19 Heather, RNFA   WISDOM TOOTH EXTRACTION  approx 0865   No complications   Social History   Tobacco Use   Smoking status: Never   Smokeless tobacco: Never  Vaping Use   Vaping Use: Never used  Substance Use Topics   Alcohol use: Yes    Comment: social   Drug use: No   Social History   Socioeconomic History   Marital status: Married    Spouse name: [place]   Number of children: 2   Years of education: Not on file   Highest education level:  Not on file  Occupational History   Occupation: Nurse    Employer: [employer]    Comment: Cath Lab  Tobacco Use   Smoking status: Never   Smokeless tobacco: Never  Vaping Use   Vaping Use: Never used  Substance and Sexual Activity   Alcohol use: Yes    Comment: social   Drug use: No   Sexual activity: Yes  Other Topics Concern   Not on file  Social History Narrative   Married, one daughter born 2018.  Lives in [place].   Orig from [place].     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   No tobacco, occas alcohol, no drugs.   Exercises 3-5 days per week.   Normal diet.      Social Determinants of Health   Financial Resource Strain: Not on file  Food Insecurity: Not on file  Transportation Needs: Not on file  Physical  Activity: Not on file  Stress: Not on file  Social Connections: Not on file  Intimate Partner Violence: Not on file   Family Status  Relation Name Status   Mother  Alive   Father  Alive   MGM  Alive   MGF  Deceased   PGM  Deceased   PGF  Deceased   Mat Uncle  (Not Specified)   Family History  Problem Relation Age of Onset   Alcohol abuse Mother    Prostate cancer Father    Heart disease Maternal Grandfather    Heart attack Maternal Grandfather 68   Clotting disorder Maternal Grandfather    Stroke Paternal Grandmother    Heart disease Maternal Uncle 94   Allergies  Allergen Reactions   Morphine And Related Itching   Sulfa Antibiotics     Pt states that both mother and father are allergic, she she thinks that she might be.   Procardia [Nifedipine] Palpitations     Patient Care Team: Maximiano Coss, NP as PCP - General (Adult Health Nurse Practitioner) Druscilla Brownie, MD as Consulting Physician (Dermatology) Alexis Frock, MD as Consulting Physician (Urology) Jerrell Belfast, MD as Consulting Physician (Otolaryngology) Marylynn Pearson, MD as Consulting Physician (Obstetrics and Gynecology)   Medications: Outpatient Medications Prior to Visit  Medication Sig   Prenatal Vit-Fe Fumarate-FA (PRENATAL MULTIVITAMIN) TABS tablet Take 1 tablet by mouth daily at 12 noon.   [DISCONTINUED] amoxicillin-clavulanate (AUGMENTIN) 875-125 MG tablet SMARTSIG:1 Tablet(s) By Mouth Every 12 Hours   [DISCONTINUED] benzonatate (TESSALON PERLES) 100 MG capsule Take 2 capsules (200 mg total) by mouth 3 (three) times daily as needed.   No facility-administered medications prior to visit.    Review of Systems  Constitutional: Negative.   HENT: Negative.    Eyes: Negative.   Respiratory: Negative.    Cardiovascular: Negative.   Gastrointestinal: Negative.   Genitourinary: Negative.   Musculoskeletal: Negative.   Skin: Negative.   Neurological: Negative.   Psychiatric/Behavioral:  Negative.    All other systems reviewed and are negative.  Last CBC Lab Results  Component Value Date   WBC 6.5 09/16/2019   HGB 14.0 09/16/2019   HCT 43.2 09/16/2019   MCV 85.9 09/16/2019   MCH 28.0 04/24/2019   RDW 13.8 09/16/2019   PLT 277.0 09/73/5329   Last metabolic panel Lab Results  Component Value Date   GLUCOSE 79 09/16/2019   NA 141 09/16/2019   K 4.4 09/16/2019   CL 108 09/16/2019   CO2 28 09/16/2019   BUN 22 09/16/2019  CREATININE 0.72 09/16/2019   CALCIUM 9.3 09/16/2019   PROT 6.9 09/16/2019   ALBUMIN 4.7 09/16/2019   BILITOT 0.5 09/16/2019   ALKPHOS 69 09/16/2019   AST 13 09/16/2019   ALT 10 09/16/2019   Last lipids Lab Results  Component Value Date   CHOL 169 09/16/2019   HDL 64.50 09/16/2019   LDLCALC 97 09/16/2019   TRIG 38.0 09/16/2019   CHOLHDL 3 09/16/2019   Last hemoglobin A1c Lab Results  Component Value Date   HGBA1C 5.5 09/16/2019   Last thyroid functions Lab Results  Component Value Date   TSH 1.36 09/16/2019   Last vitamin D Lab Results  Component Value Date   VD25OH 40.20 09/16/2019   Last vitamin B12 and Folate Lab Results  Component Value Date   VITAMINB12 209 (L) 12/04/2017        Objective:     BP (!) 125/103   Pulse 79   Temp 98 F (36.7 C) (Temporal)   Resp 18   Ht '5\' 2"'  (1.575 m)   Wt 148 lb (67.1 kg)   SpO2 100%   BMI 27.07 kg/m   BP Readings from Last 3 Encounters:  08/16/21 (!) 125/103  09/16/19 118/70  04/25/19 (!) 100/55   Wt Readings from Last 3 Encounters:  08/16/21 148 lb (67.1 kg)  09/16/19 161 lb 12.8 oz (73.4 kg)  04/23/19 190 lb 0.6 oz (86.2 kg)   SpO2 Readings from Last 3 Encounters:  08/16/21 100%  09/16/19 98%  04/25/19 99%      Physical Exam Vitals and nursing note reviewed.  Constitutional:      General: She is not in acute distress.    Appearance: Normal appearance. She is normal weight. She is not ill-appearing, toxic-appearing or diaphoretic.  HENT:     Head:  Normocephalic and atraumatic.     Right Ear: Tympanic membrane, ear canal and external ear normal. There is no impacted cerumen.     Left Ear: Tympanic membrane, ear canal and external ear normal. There is no impacted cerumen.     Nose: Nose normal. No congestion or rhinorrhea.     Mouth/Throat:     Mouth: Mucous membranes are moist.     Pharynx: Oropharynx is clear. No oropharyngeal exudate or posterior oropharyngeal erythema.  Eyes:     General: No scleral icterus.       Right eye: No discharge.        Left eye: No discharge.     Extraocular Movements: Extraocular movements intact.     Conjunctiva/sclera: Conjunctivae normal.     Pupils: Pupils are equal, round, and reactive to light.  Neck:     Vascular: No carotid bruit.  Cardiovascular:     Rate and Rhythm: Normal rate and regular rhythm.     Pulses: Normal pulses.     Heart sounds: Normal heart sounds. No murmur heard.   No friction rub. No gallop.  Pulmonary:     Effort: Pulmonary effort is normal. No respiratory distress.     Breath sounds: Normal breath sounds. No stridor. No wheezing, rhonchi or rales.  Chest:     Chest wall: No tenderness.  Abdominal:     General: Abdomen is flat. Bowel sounds are normal. There is no distension.     Palpations: There is no mass.     Tenderness: There is no abdominal tenderness. There is no right CVA tenderness, left CVA tenderness, guarding or rebound.     Hernia: No hernia is present.  Musculoskeletal:        General: No swelling, tenderness, deformity or signs of injury. Normal range of motion.     Cervical back: Normal range of motion and neck supple. No rigidity or tenderness.     Right lower leg: No edema.     Left lower leg: No edema.  Lymphadenopathy:     Cervical: No cervical adenopathy.  Skin:    General: Skin is warm and dry.     Capillary Refill: Capillary refill takes less than 2 seconds.     Coloration: Skin is not jaundiced or pale.     Findings: No bruising,  erythema, lesion or rash.  Neurological:     General: No focal deficit present.     Mental Status: She is alert and oriented to person, place, and time. Mental status is at baseline.     Cranial Nerves: No cranial nerve deficit.     Sensory: No sensory deficit.     Motor: No weakness.     Coordination: Coordination normal.     Gait: Gait normal.     Deep Tendon Reflexes: Reflexes normal.  Psychiatric:        Mood and Affect: Mood normal.        Behavior: Behavior normal.        Thought Content: Thought content normal.        Judgment: Judgment normal.     No results found for any visits on 08/16/21.    Assessment & Plan:    Routine Health Maintenance and Physical Exam  Immunization History  Administered Date(s) Administered   Hepatitis B 05/09/2011   Influenza Split 12/17/2011   Influenza,inj,Quad PF,6+ Mos 01/14/2017   MMR 05/10/2011   PFIZER(Purple Top)SARS-COV-2 Vaccination 11/10/2019, 12/01/2019   PPD Test 01/07/2012   Tdap 03/26/2005, 05/10/2011, 01/16/2019   Varicella 05/10/2011    Health Maintenance  Topic Date Due   Hepatitis C Screening  Never done   COVID-19 Vaccine (3 - Booster for [place] series) 09/01/2021 (Originally 01/26/2020)   INFLUENZA VACCINE  10/24/2021   PAP SMEAR-Modifier  03/27/2023   TETANUS/TDAP  01/15/2029   HIV Screening  Completed   HPV VACCINES  Aged Out    Discussed health benefits of physical activity, and encouraged her to engage in regular exercise appropriate for her age and condition.  Problem List Items Addressed This Visit       Other   Fatigue   Relevant Orders   TSH   B12 and Folate Panel   Vitamin D (25 hydroxy)   Other Visit Diagnoses     Annual physical exam    -  Primary   Screening for endocrine, metabolic and immunity disorder       Relevant Orders   CBC with Differential/Platelet   Comprehensive metabolic panel   Hemoglobin A1c   Lipid screening       Relevant Orders   Lipid panel   Encounter for  screening for other viral diseases       Relevant Orders   Hepatitis C Antibody      Return in about 1 year (around 08/17/2022) for CPE and labs.     PLAN Exam unremarkable Labs collected. Will follow up with the patient as warranted. Patient encouraged to call clinic with any questions, comments, or concerns.   Maximiano Coss, NP

## 2021-08-16 NOTE — Patient Instructions (Addendum)
Ms. Heather Conley to meet you!  I'll let you know how labs look  See you annually for physical and labs  Call sooner if you need anything  Thanks,  Rich     If you have lab work done today you will be contacted with your lab results within the next 2 weeks.  If you have not heard from [place] then please contact us. The fastest way to get your results is to register for My Chart.   IF you received an x-ray today, you will receive an invoice from [facility]. Please contact [facility] at [phone] with questions or concerns regarding your invoice.   IF you received labwork today, you will receive an invoice from [facility]. Please contact [facility] at [phone] with questions or concerns regarding your invoice.   Our billing staff will not be able to assist you with questions regarding bills from these companies.  You will be contacted with the lab results as soon as they are available. The fastest way to get your results is to activate your My Chart account. Instructions are located on the last page of this paperwork. If you have not heard from [place] regarding the results in 2 weeks, please contact this office.

## 2021-08-17 LAB — HEPATITIS C ANTIBODY
Hepatitis C Ab: NONREACTIVE
SIGNAL TO CUT-OFF: 0.07 (ref <1.00)

## 2021-09-27 DIAGNOSIS — S2020XA Contusion of thorax, unspecified, initial encounter: Secondary | ICD-10-CM | POA: Diagnosis not present

## 2021-09-30 DIAGNOSIS — M549 Dorsalgia, unspecified: Secondary | ICD-10-CM | POA: Diagnosis not present

## 2021-10-11 ENCOUNTER — Other Ambulatory Visit: Payer: 59

## 2021-12-27 DIAGNOSIS — L918 Other hypertrophic disorders of the skin: Secondary | ICD-10-CM | POA: Diagnosis not present

## 2021-12-27 DIAGNOSIS — D225 Melanocytic nevi of trunk: Secondary | ICD-10-CM | POA: Diagnosis not present

## 2021-12-27 DIAGNOSIS — L738 Other specified follicular disorders: Secondary | ICD-10-CM | POA: Diagnosis not present

## 2021-12-27 DIAGNOSIS — L821 Other seborrheic keratosis: Secondary | ICD-10-CM | POA: Diagnosis not present

## 2021-12-27 DIAGNOSIS — L814 Other melanin hyperpigmentation: Secondary | ICD-10-CM | POA: Diagnosis not present

## 2022-01-13 DIAGNOSIS — R051 Acute cough: Secondary | ICD-10-CM | POA: Diagnosis not present

## 2022-01-18 DIAGNOSIS — Z124 Encounter for screening for malignant neoplasm of cervix: Secondary | ICD-10-CM | POA: Diagnosis not present

## 2022-01-18 DIAGNOSIS — Z1231 Encounter for screening mammogram for malignant neoplasm of breast: Secondary | ICD-10-CM | POA: Diagnosis not present

## 2022-01-18 DIAGNOSIS — Z1151 Encounter for screening for human papillomavirus (HPV): Secondary | ICD-10-CM | POA: Diagnosis not present

## 2022-01-18 DIAGNOSIS — Z6828 Body mass index (BMI) 28.0-28.9, adult: Secondary | ICD-10-CM | POA: Diagnosis not present

## 2022-01-18 DIAGNOSIS — Z01419 Encounter for gynecological examination (general) (routine) without abnormal findings: Secondary | ICD-10-CM | POA: Diagnosis not present

## 2022-08-21 ENCOUNTER — Encounter: Payer: 59 | Admitting: Registered Nurse

## 2022-11-10 IMAGING — NM NM HEPATO W/GB/PHARM/[PERSON_NAME]
2 series · 12 of 12 positions shown · non-contrast
Comparison: Ultrasound 05/17/2020

CLINICAL DATA: Right upper quadrant pain

EXAM:
NUCLEAR MEDICINE HEPATOBILIARY IMAGING WITH GALLBLADDER EF
TECHNIQUE: Sequential images of the abdomen were obtained [DATE] minutes
following intravenous administration of radiopharmaceutical. After
oral ingestion of Ensure, gallbladder ejection fraction was
determined. At 60 min, normal ejection fraction is greater than 33%.
RADIOPHARMACEUTICALS:  5.3 mCi 9c-BBm  Choletec IV

[he hepatobiliary · 4.52mm/px · 6 of 60 frames shown (1 of 2)]
[frame 6/60]
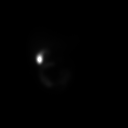
[frame 16/60]
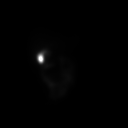
[frame 26/60]
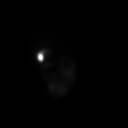
[frame 36/60]
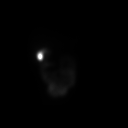
[frame 46/60]
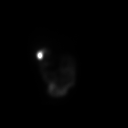
[frame 56/60]
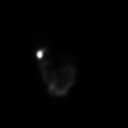

[he hepatobiliary · 4.52mm/px · 6 of 60 frames shown (2 of 2)]
[frame 6/60]
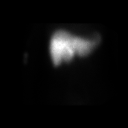
[frame 16/60]
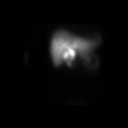
[frame 26/60]
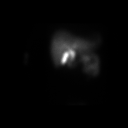
[frame 36/60]
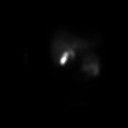
[frame 46/60]
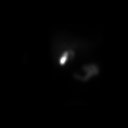
[frame 56/60]
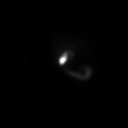

[12 of 12 positions shown; findings below may reference images not displayed]

FINDINGS: Prompt uptake and biliary excretion of activity by the liver is
seen. Gallbladder activity is visualized, consistent with patency of
cystic duct. Biliary activity passes into small bowel, consistent
with patent common bile duct.

Calculated gallbladder ejection fraction is 43%. (Normal gallbladder
ejection fraction with Ensure is greater than 33%.)
IMPRESSION: Negative examination

## 2022-12-28 IMAGING — CT CT ABD-PELV W/ CM
2 of 4 series · 16 of 46 positions shown, 18 images · IV contrast (Omnipaque)
Comparison: Ultrasound May 17, 2020 and CT December 09, 2012

CLINICAL DATA: Bilateral lower abdominal pain radiating to the back
times 1-2 months.

EXAM:
CT ABDOMEN AND PELVIS WITH CONTRAST
TECHNIQUE: Multidetector CT imaging of the abdomen and pelvis was performed
using the standard protocol following bolus administration of
intravenous contrast.
CONTRAST:  85mL OMNIPAQUE IOHEXOL 350 MG/ML SOLN

[Series 2: axial st · axial · 0.93mm/px · z∈[-475,-75]mm · 13 of 88 slices shown, 15 images]
[im 4/88  soft-tissue]
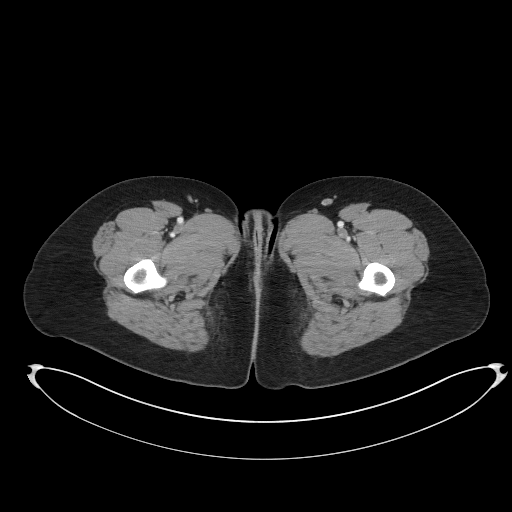
[im 4/88  bone]
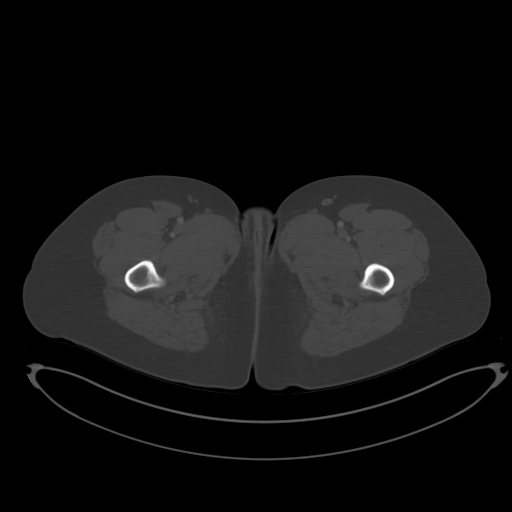
[im 11/88  soft-tissue]
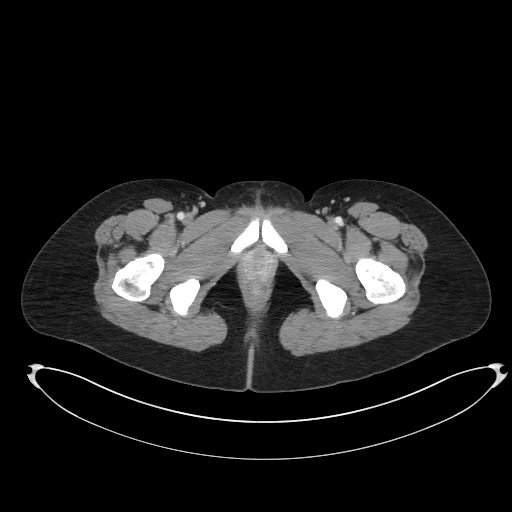
[im 19/88  soft-tissue]
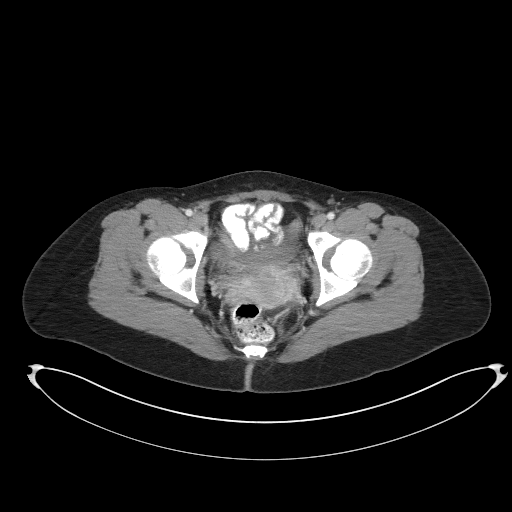
[im 26/88  soft-tissue]
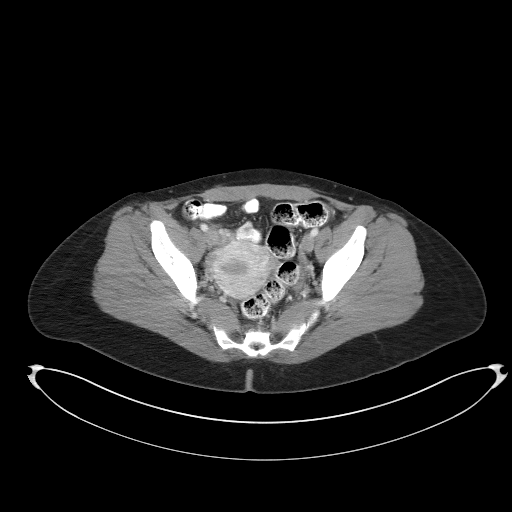
[im 30/88  soft-tissue]
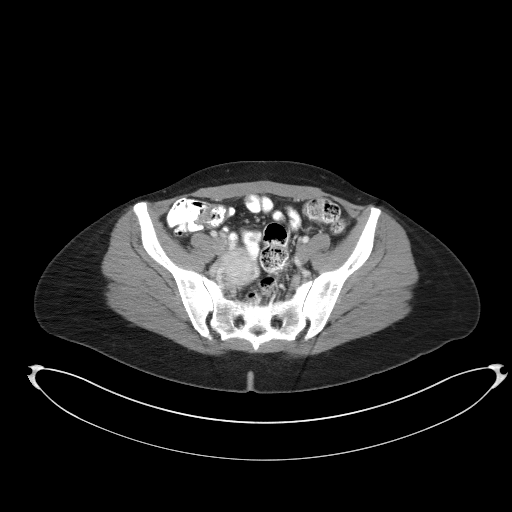
[im 37/88  soft-tissue]
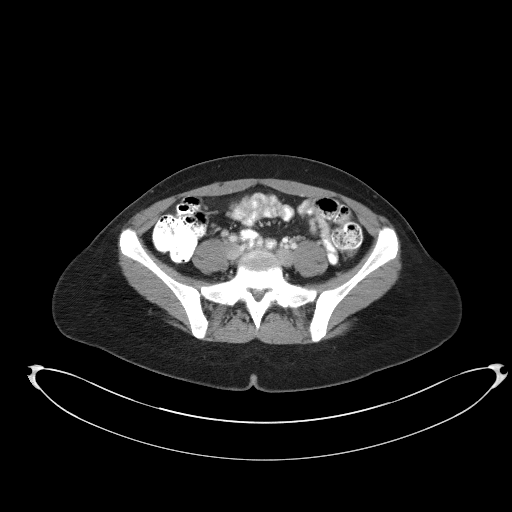
[im 44/88  soft-tissue]
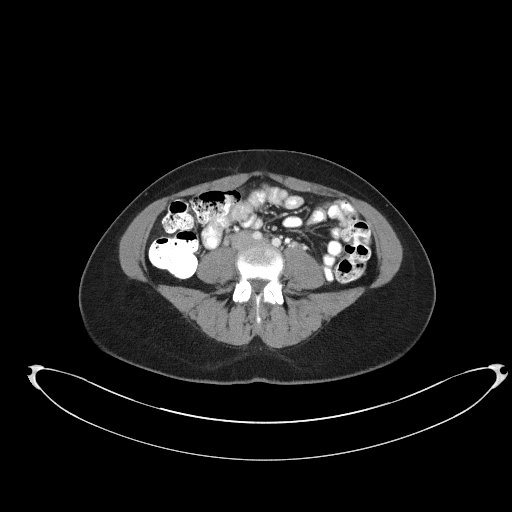
[im 51/88  soft-tissue]
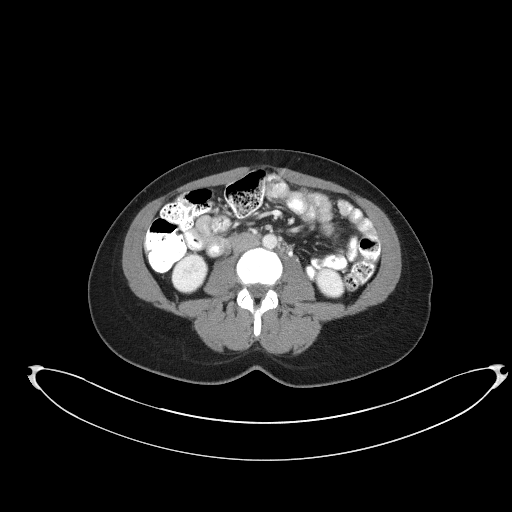
[im 59/88  soft-tissue]
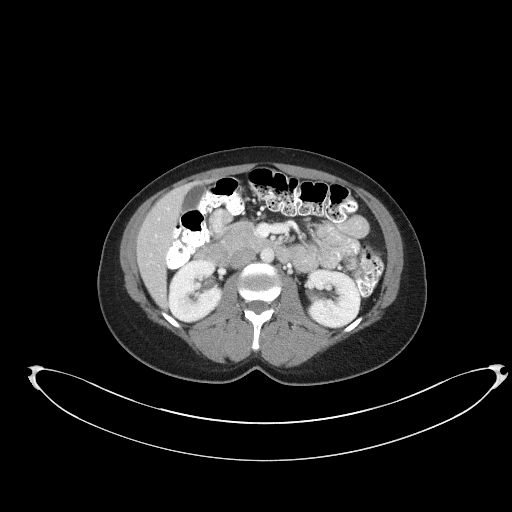
[im 59/88  bone]
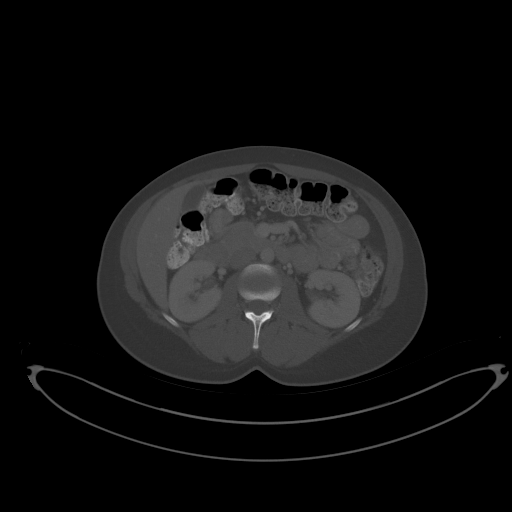
[im 62/88  soft-tissue]
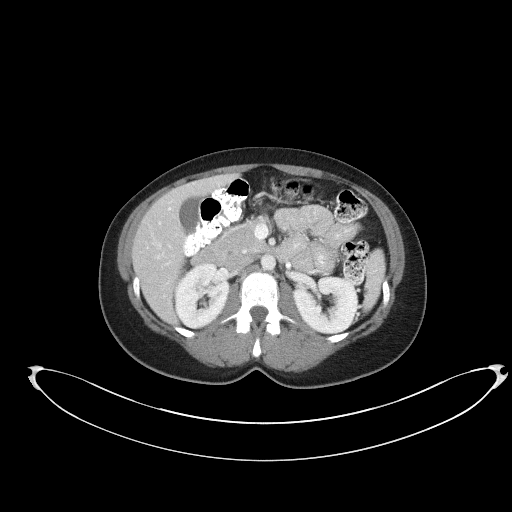
[im 69/88  soft-tissue]
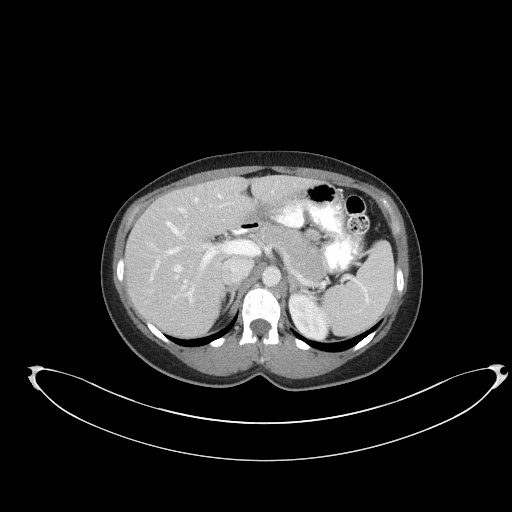
[im 77/88  soft-tissue]
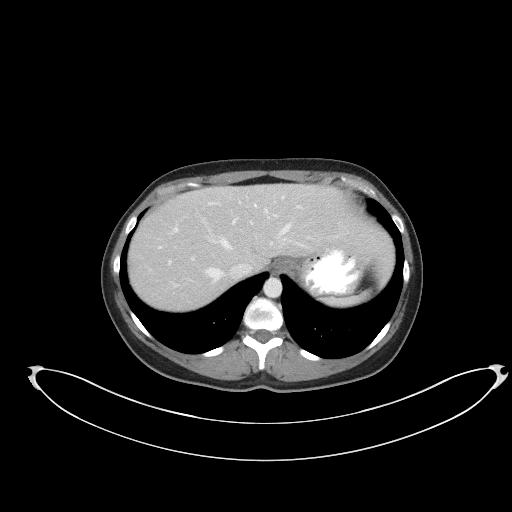
[im 84/88  soft-tissue]
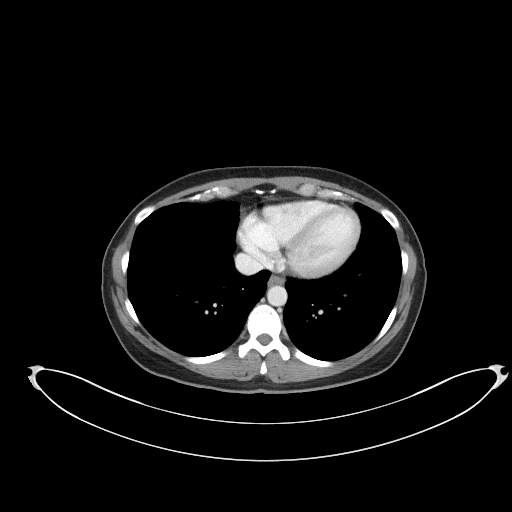

[Series 5: coronal st · coronal · 0.84mm/px · 3 of 76 slices shown]
[im 26/76  soft-tissue]
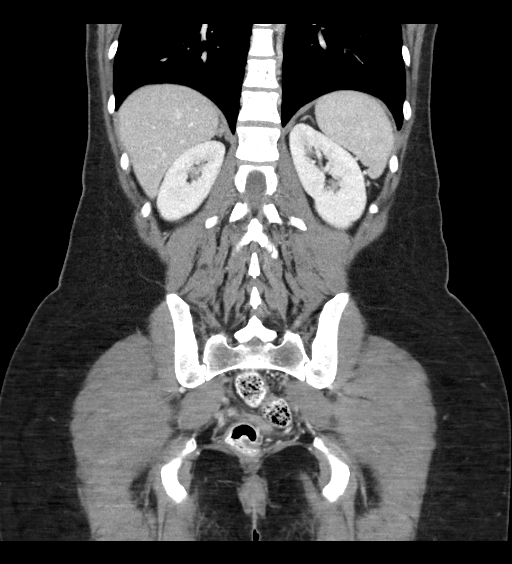
[im 34/76  soft-tissue]
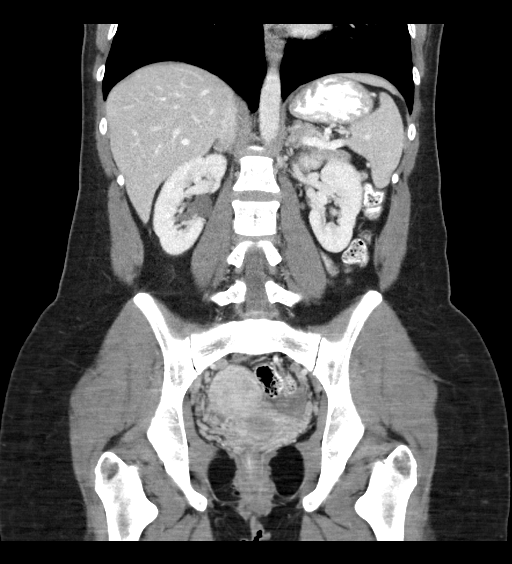
[im 42/76  soft-tissue]
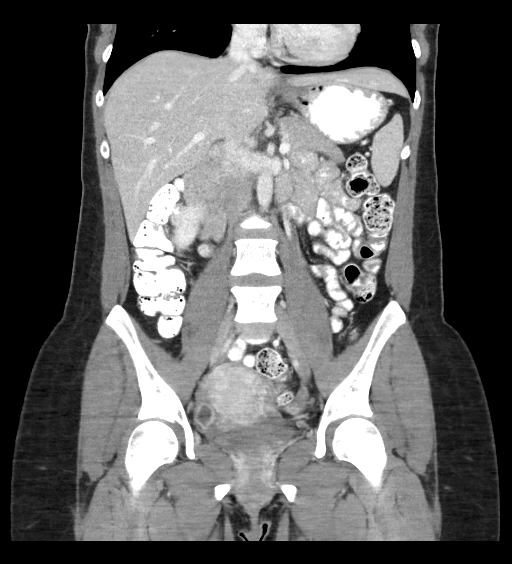

[16 of 46 positions shown; findings below may reference images not displayed]

FINDINGS: Lower chest: No acute abnormality.

Hepatobiliary: No suspicious hepatic lesions. Gallbladder is
unremarkable. No biliary ductal dilation.

Pancreas: Unremarkable. No pancreatic ductal dilatation or
surrounding inflammatory changes.

Spleen: Normal in size without focal abnormality.

Adrenals/Urinary Tract: Bilateral adrenal glands are unremarkable.
No hydronephrosis. Right subcentimeter hypodense renal lesion which
is technically too small to accurately characterize but
statistically likely represent a cyst. No solid enhancing renal
masses. Urinary bladder is decompressed.

Stomach/Bowel: Radiopaque enteric contrast traverses the rectum.
Stomach is unremarkable for degree of distension. No pathologic
dilation of small or large bowel. The appendix and terminal ileum
appear normal. Small volume of formed stool throughout the colon. No
evidence of acute bowel inflammation.

Vascular/Lymphatic: No significant vascular findings are present. No
enlarged abdominal or pelvic lymph nodes.

Reproductive: Uterus and left ovary are unremarkable. 1.7 cm corpus
luteal cyst in the right ovary.

Other: Trace physiologic pelvic free fluid.

Musculoskeletal: No acute or significant osseous findings.
IMPRESSION: 1. No acute abnormality in the abdomen or pelvis.
[DATE] cm corpus luteal cyst in the right ovary, requiring no
further assessment or follow-up. Note: This recommendation does not
apply to premenarchal patients and to those with increased risk
(genetic, family history, elevated tumor markers or other high-risk
factors) of ovarian cancer. Reference: JACR [DATE]):[id number]

## 2023-06-20 ENCOUNTER — Encounter: Payer: Self-pay | Admitting: Physician Assistant

## 2023-08-15 ENCOUNTER — Ambulatory Visit: Payer: Self-pay | Admitting: Physician Assistant
